# Patient Record
Sex: Female | Born: 1935 | Race: White | Hispanic: No | Marital: Single | Smoking: Never smoker
Health system: Southern US, Community
[De-identification: ages and names within clinical notes are randomized; demographics above are authoritative.]

## PROBLEM LIST (undated history)

## (undated) DIAGNOSIS — D649 Anemia, unspecified: Secondary | ICD-10-CM

## (undated) HISTORY — DX: Anemia, unspecified: D64.9

## (undated) HISTORY — PX: ABDOMINAL HYSTERECTOMY: SHX81

---

## 2014-06-28 ENCOUNTER — Encounter (HOSPITAL_BASED_OUTPATIENT_CLINIC_OR_DEPARTMENT_OTHER): Payer: Self-pay | Admitting: *Deleted

## 2014-06-28 ENCOUNTER — Emergency Department (HOSPITAL_BASED_OUTPATIENT_CLINIC_OR_DEPARTMENT_OTHER)
Admission: EM | Admit: 2014-06-28 | Discharge: 2014-06-29 | Disposition: A | Discharge status: Home / Self Care | Payer: Medicare Other | Attending: Emergency Medicine | Admitting: Emergency Medicine

## 2014-06-28 DIAGNOSIS — L03115 Cellulitis of right lower limb: Secondary | ICD-10-CM | POA: Diagnosis not present

## 2014-06-28 LAB — CBC
HEMATOCRIT: 39.7 % (ref 36.0–46.0)
Hemoglobin: 13 g/dL (ref 12.0–15.0)
MCH: 32.7 pg (ref 26.0–34.0)
MCHC: 32.7 g/dL (ref 30.0–36.0)
MCV: 100 fL (ref 78.0–100.0)
Platelets: 200 10*3/uL (ref 150–400)
RBC: 3.97 MIL/uL (ref 3.87–5.11)
RDW: 12.5 % (ref 11.5–15.5)
WBC: 5.4 10*3/uL (ref 4.0–10.5)

## 2014-06-28 LAB — BASIC METABOLIC PANEL
Anion gap: 7 (ref 5–15)
BUN: 21 mg/dL — ABNORMAL HIGH (ref 6–20)
CALCIUM: 9.2 mg/dL (ref 8.9–10.3)
CO2: 29 mmol/L (ref 22–32)
Chloride: 103 mmol/L (ref 101–111)
Creatinine, Ser: 0.7 mg/dL (ref 0.44–1.00)
Glucose, Bld: 90 mg/dL (ref 70–99)
Potassium: 4 mmol/L (ref 3.5–5.1)
Sodium: 139 mmol/L (ref 135–145)

## 2014-06-28 MED ORDER — OXYCODONE-ACETAMINOPHEN 5-325 MG PO TABS
1.0000 | ORAL_TABLET | Freq: Once | ORAL | Status: AC
  Administered 2014-06-28: 1 via ORAL
  Filled 2014-06-28: qty 1

## 2014-06-28 MED ORDER — CLINDAMYCIN PHOSPHATE 600 MG/50ML IV SOLN
600.0000 mg | Freq: Once | INTRAVENOUS | Status: AC
  Administered 2014-06-28: 600 mg via INTRAVENOUS
  Filled 2014-06-28: qty 50

## 2014-06-28 MED ORDER — CLINDAMYCIN HCL 150 MG PO CAPS: 300.0000 mg | ORAL_CAPSULE | Freq: Three times a day (TID) | ORAL | Status: DC

## 2014-06-28 NOTE — Discharge Instructions (Signed)
Return to the ED with any concerns including fever/chills, vomiting and not able to keep down liquids or antibiotics, increased area of redness, pus draining from wound, decreased level of alertness/lethargy, or any other alarming symptoms

## 2014-06-28 NOTE — ED Provider Notes (Signed)
CSN: [id number]     Arrival date & time 06/28/14  1942 History   First MD Initiated Contact with Patient 06/28/14 2035    This chart was scribed for No att. providers found by Marica OtterNusrat Rahman, ED Scribe. This patient was seen in room MHOTF/OTF and the patient's care was started at 9:29 PM.  Chief Complaint  Patient presents with  . Cellulitis   Patient is a 81 y.o. female presenting with general illness. The history is provided by the patient and a relative. No language interpreter was used.  Illness Location:  Rigth ankle & right foot Quality:  Swelling and redness Severity:  Severe Onset quality:  Sudden Duration:  1 day Timing:  Constant Chronicity:  New Associated symptoms: no fever and no vomiting    PCP: Pcp Not In System HPI Comments: Tiffany [name] is a 81 y.o. female, with PMH noted below and who uses a walker at baseline, who presents to the Emergency Department complaining of sudden onset of redness and swelling to her right ankle and foot onset yesterday. Per daughter, pt fell 10 days ago and sustained a laceration to the right lower extremity; however, at time of injury, there was no redness to the said area. Pt denies fever, vomiting, appetite change, activity change, medicinal allergies, Hx of DM, Hx of kidney problems. At the time of injury she was seen at urgent care, wound was cleaned and dressed, she was given rx for keflex, daughter states she took 2 days of this and forgot to take the rest.    History reviewed. No pertinent past medical history. Past Surgical History  Procedure Laterality Date  . Abdominal hysterectomy     History reviewed. No pertinent family history. History  Substance Use Topics  . Smoking status: Never Smoker   . Smokeless tobacco: Not on file  . Alcohol Use: No   OB History    No data available     Review of Systems  Constitutional: Negative for fever, chills, activity change and appetite change.  Cardiovascular: Positive for leg swelling  (right leg swelling with associated redness and right foot swelling ).  Gastrointestinal: Negative for vomiting.  All other systems reviewed and are negative.  Allergies  Review of patient's allergies indicates no known allergies.  Home Medications   Prior to Admission medications   Medication Sig Start Date End Date Taking? Authorizing Provider  clindamycin (CLEOCIN) 150 MG capsule Take 2 capsules (300 mg total) by mouth 3 (three) times daily. 06/28/14   Jerelyn ScottMartha Linker, MD   Triage Vitals: BP 117/74 mmHg  Pulse 70  Temp(Src) 98.3 F (36.8 C) (Oral)  Resp 20  Ht 5\' 2"  (1.575 m)  Wt 175 lb (79.379 kg)  BMI 32.00 kg/m2  SpO2 98% Vitals reviewed Physical Exam  Physical Examination: General appearance - alert, well appearing, and in no distress Mental status - alert, oriented to person, place, and time Eyes - no conjunctival injection no scleral icterus Chest - clear to auscultation, no wheezes, rales or rhonchi, symmetric air entry Heart - normal rate, regular rhythm, normal S1, S2, no murmurs, rubs, clicks or gallops Abdomen - soft, nontender, nondistended, no masses or organomegaly Musculoskeletal - no joint tenderness, deformity or swelling Extremities - peripheral pulses normal, no pedal edema, no clubbing or cyanosis Skin - erythema of right lower extremity around a partially healing laceration, no prurulent drainage  ED Course  Procedures (including critical care time) DIAGNOSTIC STUDIES: Oxygen Saturation is 98% on RA, nl by my  interpretation.    COORDINATION OF CARE: 9:32 PM-Discussed treatment plan which includes labs, possible admittance to hospital depending on lab results, and antibiotics with pt at bedside and pt agreed to plan.   Labs Review Labs Reviewed  BASIC METABOLIC PANEL - Abnormal; Notable for the following:    BUN 21 (*)    All other components within normal limits  CBC    Imaging Review No results found.   EKG Interpretation None      MDM    Final diagnoses:  Cellulitis of right lower extremity   Pt presents with c/o erythema and pain of right lower extremity around a wound that she sustained approx 1 week ago.  Erythema began yesterday.  Right lower extremity is warm, red around a central healing laceration.  Labs are reassuring with no leukocytosis.  Pt was given IV clindamycin in the ED.  She has not had trial of antibitoics for cellulitis as this began yesterday and one week ago she only had 2 days worth of keflex. Pt advised to have area rechecked in 2 days.  Doubt DVT or other acute process at this time.  Pt is overall nontoxic and well hydrated in appearance.   Discharged with strict return precautions.  Pt agreeable with plan.   I personally performed the services described in this documentation, which was scribed in my presence. The recorded information has been reviewed and is accurate.     Jerelyn ScottMartha Linker, MD 06/30/14 [phone]

## 2014-06-28 NOTE — ED Notes (Signed)
Swelling, redness, warmth noted to right leg.

## 2016-12-11 ENCOUNTER — Observation Stay (HOSPITAL_COMMUNITY)
Admission: EM | Admit: 2016-12-11 | Discharge: 2016-12-12 | Disposition: A | Discharge status: Home / Self Care | Payer: Medicare Other | Attending: Nephrology | Admitting: Nephrology

## 2016-12-11 ENCOUNTER — Encounter (HOSPITAL_COMMUNITY): Payer: Self-pay

## 2016-12-11 DIAGNOSIS — R262 Difficulty in walking, not elsewhere classified: Secondary | ICD-10-CM | POA: Diagnosis not present

## 2016-12-11 DIAGNOSIS — L03115 Cellulitis of right lower limb: Secondary | ICD-10-CM | POA: Diagnosis not present

## 2016-12-11 DIAGNOSIS — L0390 Cellulitis, unspecified: Secondary | ICD-10-CM | POA: Diagnosis present

## 2016-12-11 DIAGNOSIS — N8110 Cystocele, unspecified: Secondary | ICD-10-CM | POA: Diagnosis not present

## 2016-12-11 LAB — CBC WITH DIFFERENTIAL/PLATELET
BASOS PCT: 0 %
Basophils Absolute: 0 10*3/uL (ref 0.0–0.1)
EOS ABS: 0.2 10*3/uL (ref 0.0–0.7)
Eosinophils Relative: 5 %
HCT: 38.6 % (ref 36.0–46.0)
Hemoglobin: 12.7 g/dL (ref 12.0–15.0)
Lymphocytes Relative: 41 %
Lymphs Abs: 2 10*3/uL (ref 0.7–4.0)
MCH: 32.3 pg (ref 26.0–34.0)
MCHC: 32.9 g/dL (ref 30.0–36.0)
MCV: 98.2 fL (ref 78.0–100.0)
MONOS PCT: 13 %
Monocytes Absolute: 0.6 10*3/uL (ref 0.1–1.0)
Neutro Abs: 2 10*3/uL (ref 1.7–7.7)
Neutrophils Relative %: 41 %
Platelets: 189 10*3/uL (ref 150–400)
RBC: 3.93 MIL/uL (ref 3.87–5.11)
RDW: 12.9 % (ref 11.5–15.5)
WBC: 4.9 10*3/uL (ref 4.0–10.5)

## 2016-12-11 LAB — COMPREHENSIVE METABOLIC PANEL
ALK PHOS: 68 U/L (ref 38–126)
ALT: 19 U/L (ref 14–54)
AST: 25 U/L (ref 15–41)
Albumin: 3.8 g/dL (ref 3.5–5.0)
Anion gap: 8 (ref 5–15)
BUN: 17 mg/dL (ref 6–20)
CALCIUM: 9.3 mg/dL (ref 8.9–10.3)
CO2: 26 mmol/L (ref 22–32)
Chloride: 105 mmol/L (ref 101–111)
Creatinine, Ser: 0.71 mg/dL (ref 0.44–1.00)
GFR calc non Af Amer: >60 mL/min (ref >60)
Glucose, Bld: 105 mg/dL — ABNORMAL HIGH (ref 65–99)
Potassium: 3.7 mmol/L (ref 3.5–5.1)
SODIUM: 139 mmol/L (ref 135–145)
Total Bilirubin: 0.6 mg/dL (ref 0.3–1.2)
Total Protein: 7.5 g/dL (ref 6.5–8.1)

## 2016-12-11 MED ORDER — DOXYCYCLINE HYCLATE 100 MG PO TABS
100.0000 mg | ORAL_TABLET | Freq: Once | ORAL | Status: AC
  Administered 2016-12-12: 100 mg via ORAL
  Filled 2016-12-11: qty 1

## 2016-12-11 NOTE — ED Notes (Signed)
 on bladder scan and pt states that she has discomfort in bladder area. Prolapsed viewed by this RN, not completely out but it is right at the labia.

## 2016-12-11 NOTE — ED Triage Notes (Signed)
Pt here with her daughter due to vaginal prolapse. Pt's daughter stated that the pt was complaining of anuria and she looked and "it looked like the pt's bladder was hanging out" VSS. Pt denies pain.

## 2016-12-11 NOTE — ED Provider Notes (Signed)
[facility] EMERGENCY DEPARTMENT Provider Note   CSN: [id number] Arrival date & time: 12/11/16  2102     History   Chief Complaint Chief Complaint  Patient presents with  . Vaginal Prolapse    HPI Tiffany [name] is a 83 y.o. female.  HPI  This is a 83 year oldfemale with a history of abdominal hysterectomyho presents with concerns for vaginal prolapse. Family member states that patient has had difficulty urinating this evening.  She noted a bulge in the patient's vagina. She tried to push it back but it recurred. Patient has never had anything like this before. Denies any fevers, abdominal pain, nausea, vomiting. She does report some redness to the right lower extremity for this last several days with associated swelling. She has been using home remedies for this. No fevers.  History reviewed. No pertinent past medical history.  Patient Active Problem List   Diagnosis Date Noted  . Cellulitis 12/12/2016    Past Surgical History:  Procedure Laterality Date  . ABDOMINAL HYSTERECTOMY      OB History    No data available       Home Medications    Prior to Admission medications   Medication Sig Start Date End Date Taking? Authorizing Provider  clindamycin (CLEOCIN) 150 MG capsule Take 2 capsules (300 mg total) by mouth 3 (three) times daily. Patient not taking: Reported on 12/11/2016 06/28/14   Mabe, Latanya Maudlin, MD    Family History History reviewed. No pertinent family history.  Social History Social History  Substance Use Topics  . Smoking status: Never Smoker  . Smokeless tobacco: Not on file  . Alcohol use No     Allergies   Patient has no known allergies.   Review of Systems Review of Systems  Constitutional: Negative for fever.  Respiratory: Negative for shortness of breath.   Cardiovascular: Positive for leg swelling. Negative for chest pain.  Gastrointestinal: Negative for abdominal pain, nausea and vomiting.  Genitourinary:  Positive for difficulty urinating.       Mass/bulge in vagina  Musculoskeletal: Positive for back pain.  Skin: Positive for color change.  All other systems reviewed and are negative.    Physical Exam Updated Vital Signs BP (!) 108/49   Pulse 91   Temp 98.7 F (37.1 C) (Oral)   Resp 18   Ht 5\' 2"  (1.575 m)   Wt 75.3 kg (166 lb)   SpO2 96%   BMI 30.36 kg/m   Physical Exam  Constitutional: She is oriented to person, place, and time. She appears well-developed and well-nourished.  Elderly, well-appearing for stated age  HENT:  Head: Normocephalic and atraumatic.  Cardiovascular: Normal rate, regular rhythm and normal heart sounds.   Pulmonary/Chest: Effort normal and breath sounds normal. No respiratory distress. She has no wheezes.  Abdominal: Soft. Bowel sounds are normal. There is no tenderness.  Genitourinary:  Genitourinary Comments: Initial examination important position without evidence of obvious prolapse, patient spontaneously urinating, general vaginal atrophy noted Appears to be posterior prolapse of the vaginal wall  Musculoskeletal: She exhibits edema.  1+ bilateral lower extremity slightly increased on the right  Neurological: She is alert and oriented to person, place, and time.  Skin: Skin is warm and dry.  Warmth, redness, erythema to the right lower extremity blanching, tenderness palpation, excoriation over the anterior chest  Psychiatric: She has a normal mood and affect.  Nursing note and vitals reviewed.    ED Treatments / Results  Labs (all labs ordered  are listed, but only abnormal results are displayed) Labs Reviewed  COMPREHENSIVE METABOLIC PANEL - Abnormal; Notable for the following:       Result Value   Glucose, Bld 105 (*)    All other components within normal limits  URINALYSIS, ROUTINE W REFLEX MICROSCOPIC - Abnormal; Notable for the following:    Hgb urine dipstick SMALL (*)    Squamous Epithelial / LPF 0-5 (*)    All other components  within normal limits  CBC WITH DIFFERENTIAL/PLATELET  I-STAT CG4 LACTIC ACID, ED  I-STAT CG4 LACTIC ACID, ED    EKG  EKG Interpretation None       Radiology No results found.  Procedures Procedures (including critical care time)  Medications Ordered in ED Medications  doxycycline (VIBRA-TABS) tablet 100 mg (100 mg Oral Given 12/12/16 0036)  HYDROcodone-acetaminophen (NORCO/VICODIN) 5-325 MG per tablet 1 tablet (1 tablet Oral Given 12/12/16 0032)     Initial Impression / Assessment and Plan / ED Course  I have reviewed the triage vital signs and the nursing notes.  Pertinent labs & imaging results that were available during my care of the patient were reviewed by me and considered in my medical decision making (see chart for details).     Patient presents with difficulty urinating and concern for vaginal prolapse. Also redness and swelling to the right lower extremity. She is otherwise nontoxic. Afebrile.  GU exam was difficult to appreciate prolapse in the supine position. She was able to spontaneously void. However upon standing it appears she has a posterior vaginal wall prolapse. I discussed this with Dr. Adrian BlackwaterStinson. She may need a pessary but this is not emergent and she is able to urinate in a supine position. Additionally, she has evidence of cellulitis to the right lower extremity with increased pain. She is a 3 person assist with ambulation secondary to pain. She has 1 caregiver at home. Given her age and acute infection, feel she would benefit from physical therapy evaluation and further management under observation.  Patient was given one dose of doxycycline. She does not appear septic or systemically ill. Final Clinical Impressions(s) / ED Diagnoses   Final diagnoses:  Cellulitis of right lower extremity  Vaginal prolapse  Difficulty walking    New Prescriptions New Prescriptions   No medications on file     Shon BatonHorton, Courtney F, MD 12/12/16 [phone]

## 2016-12-12 ENCOUNTER — Observation Stay (HOSPITAL_BASED_OUTPATIENT_CLINIC_OR_DEPARTMENT_OTHER): Payer: Medicare Other

## 2016-12-12 ENCOUNTER — Encounter (HOSPITAL_COMMUNITY): Payer: Self-pay | Admitting: Internal Medicine

## 2016-12-12 DIAGNOSIS — R262 Difficulty in walking, not elsewhere classified: Secondary | ICD-10-CM

## 2016-12-12 DIAGNOSIS — N8110 Cystocele, unspecified: Secondary | ICD-10-CM | POA: Diagnosis present

## 2016-12-12 DIAGNOSIS — L03115 Cellulitis of right lower limb: Principal | ICD-10-CM

## 2016-12-12 DIAGNOSIS — L0390 Cellulitis, unspecified: Secondary | ICD-10-CM | POA: Diagnosis present

## 2016-12-12 LAB — CBC
HCT: 34.3 % — ABNORMAL LOW (ref 36.0–46.0)
HEMOGLOBIN: 10.9 g/dL — AB (ref 12.0–15.0)
MCH: 31.3 pg (ref 26.0–34.0)
MCHC: 31.8 g/dL (ref 30.0–36.0)
MCV: 98.6 fL (ref 78.0–100.0)
PLATELETS: 160 10*3/uL (ref 150–400)
RBC: 3.48 MIL/uL — AB (ref 3.87–5.11)
RDW: 13.3 % (ref 11.5–15.5)
WBC: 4.9 10*3/uL (ref 4.0–10.5)

## 2016-12-12 LAB — BASIC METABOLIC PANEL
ANION GAP: 7 (ref 5–15)
BUN: 15 mg/dL (ref 6–20)
CALCIUM: 8.7 mg/dL — AB (ref 8.9–10.3)
CO2: 29 mmol/L (ref 22–32)
CREATININE: 0.73 mg/dL (ref 0.44–1.00)
Chloride: 103 mmol/L (ref 101–111)
Glucose, Bld: 91 mg/dL (ref 65–99)
Potassium: 3.3 mmol/L — ABNORMAL LOW (ref 3.5–5.1)
SODIUM: 139 mmol/L (ref 135–145)

## 2016-12-12 LAB — URINALYSIS, ROUTINE W REFLEX MICROSCOPIC
BACTERIA UA: NONE SEEN
BILIRUBIN URINE: NEGATIVE
GLUCOSE, UA: NEGATIVE mg/dL
Ketones, ur: NEGATIVE mg/dL
LEUKOCYTES UA: NEGATIVE
Nitrite: NEGATIVE
Protein, ur: NEGATIVE mg/dL
Specific Gravity, Urine: 1.01 (ref 1.005–1.030)
pH: 6 (ref 5.0–8.0)

## 2016-12-12 LAB — I-STAT CG4 LACTIC ACID, ED: LACTIC ACID, VENOUS: 1.02 mmol/L (ref 0.5–1.9)

## 2016-12-12 MED ORDER — ACETAMINOPHEN 650 MG RE SUPP: 650.0000 mg | Freq: Four times a day (QID) | RECTAL | Status: DC | PRN

## 2016-12-12 MED ORDER — ENOXAPARIN SODIUM 40 MG/0.4ML ~~LOC~~ SOLN
40.0000 mg | SUBCUTANEOUS | Status: DC
  Administered 2016-12-12: 40 mg via SUBCUTANEOUS
  Filled 2016-12-12: qty 0.4

## 2016-12-12 MED ORDER — HYDROCODONE-ACETAMINOPHEN 5-325 MG PO TABS
1.0000 | ORAL_TABLET | Freq: Once | ORAL | Status: AC
  Administered 2016-12-12: 1 via ORAL
  Filled 2016-12-12: qty 1

## 2016-12-12 MED ORDER — ONDANSETRON HCL 4 MG/2ML IJ SOLN: 4.0000 mg | Freq: Four times a day (QID) | INTRAMUSCULAR | Status: DC | PRN

## 2016-12-12 MED ORDER — DOXYCYCLINE HYCLATE 100 MG PO CAPS: 100.0000 mg | ORAL_CAPSULE | Freq: Two times a day (BID) | ORAL | 0 refills | Status: AC

## 2016-12-12 MED ORDER — DEXTROSE 5 % IV SOLN
100.0000 mg | Freq: Two times a day (BID) | INTRAVENOUS | Status: DC
  Administered 2016-12-12: 100 mg via INTRAVENOUS
  Filled 2016-12-12 (×2): qty 100

## 2016-12-12 MED ORDER — ACETAMINOPHEN 325 MG PO TABS
650.0000 mg | ORAL_TABLET | Freq: Four times a day (QID) | ORAL | Status: DC | PRN
  Administered 2016-12-12: 650 mg via ORAL
  Filled 2016-12-12: qty 2

## 2016-12-12 MED ORDER — ONDANSETRON HCL 4 MG PO TABS: 4.0000 mg | ORAL_TABLET | Freq: Four times a day (QID) | ORAL | Status: DC | PRN

## 2016-12-12 NOTE — Discharge Summary (Signed)
Physician Discharge Summary  Tiffany [name] ZOX:[id number] DOB: [birth year] DOA: 12/11/2016  PCP: System, Pcp Not In  Admit date: 12/11/2016 Discharge date: 12/12/2016  Admitted From:home Disposition:home  Recommendations for Outpatient Follow-up:  1. Follow up with PCP in 1-2 weeks 2. Please obtain BMP/CBC in one week   Home Health:no, goes home with daughter Equipment/Devices:no Discharge Condition:stable CODE STATUS:full code Diet recommendation:heart healthy  Brief/Interim Summary: 83 year old female with no significant past medical history presented with vaginal prolapse and causing difficulty walking.Gyn Dr. Adrian Blackwater was contacted by ER recommended outpatient follow up.  Patient was admitted for right lower extremity cellulitis. Treated with IV doxycycline.patient Dilaudid acute metabolic encephalopathy due to hydrocodone. She was lethargic this morning. Later in the afternoon she is alert awake and oriented. I spoke with patient's daughter who is patient's caretaker reported that the patient's mental status is at baseline. She is willing to take patient home today. Ordered oral doxycycline. Recommended to follow up with PCP for home care services. At this time patient is medically stable to transfer her care to outpatient. -Doppler ultrasound negative for DVT.  Discharge Diagnoses:  Principal Problem:   Cellulitis of right lower extremity Active Problems:   Cellulitis   Vaginal prolapse    Discharge Instructions  Discharge Instructions    Call MD for:  difficulty breathing, headache or visual disturbances    Complete by:  As directed    Call MD for:  extreme fatigue    Complete by:  As directed    Call MD for:  hives    Complete by:  As directed    Call MD for:  persistant dizziness or light-headedness    Complete by:  As directed    Call MD for:  persistant nausea and vomiting    Complete by:  As directed    Call MD for:  severe uncontrolled pain    Complete by:  As  directed    Call MD for:  temperature >100.4    Complete by:  As directed    Diet - low sodium heart healthy    Complete by:  As directed    Increase activity slowly    Complete by:  As directed      Allergies as of 12/12/2016   No Known Allergies     Medication List    STOP taking these medications   clindamycin 150 MG capsule Commonly known as:  CLEOCIN     TAKE these medications   doxycycline 100 MG capsule Commonly known as:  VIBRAMYCIN Take 1 capsule (100 mg total) by mouth 2 (two) times daily.      Follow-up Information    [facility]. Schedule an appointment as soon as possible for a visit in 1 week(s).   Contact information: [address] [facility] [address] [phone]       Levie Heritage, DO. Schedule an appointment as soon as possible for a visit in 1 week(s).   Specialty:  Family Medicine Contact information: [address] [place] [address] [phone]          No Known Allergies  Consultations: ER consulted Dr. Adrian Blackwater from Gyn  Procedures/Studies: Doppler ultrasound  Subjective: Seen and examined at bedside.Patient is alert awake and mental status around baseline.  Discharge Exam: Vitals:   12/12/16 0423 12/12/16 1000  BP: (!) 99/54 112/66  Pulse: 72 82  Resp: 19 20  Temp: 97.7 F (36.5 C) 98 F (36.7 C)  SpO2:  98% 98%   Vitals:   12/12/16 0200 12/12/16 0300 12/12/16 0423 12/12/16 1000  BP: 104/86 (!) 119/94 (!) 99/54 112/66  Pulse: 90 82 72 82  Resp:   19 20  Temp:   97.7 F (36.5 C) 98 F (36.7 C)  TempSrc:   Oral Oral  SpO2: 96% 97% 98% 98%  Weight:   75.3 kg (166 lb 0.1 oz)   Height:   5\' 2"  (1.575 m)     General: Pt is alert, awake, not in acute distress Cardiovascular: RRR, S1/S2 +, no rubs, no gallops Respiratory: CTA bilaterally, no wheezing, no rhonchi Abdominal: Soft, NT, ND, bowel sounds + Extremities: no edema, no cyanosis, right lower  extremity has a small open wound requires frequent dressing change.    The results of significant diagnostics from this hospitalization (including imaging, microbiology, ancillary and laboratory) are listed below for reference.     Microbiology: No results found for this or any previous visit (from the past 240 hour(s)).   Labs: BNP (last 3 results) No results for input(s): BNP in the last 8760 hours. Basic Metabolic Panel:  Recent Labs Lab 12/11/16 2149 12/12/16 0404  NA 139 139  K 3.7 3.3*  CL 105 103  CO2 26 29  GLUCOSE 105* 91  BUN 17 15  CREATININE 0.71 0.73  CALCIUM 9.3 8.7*   Liver Function Tests:  Recent Labs Lab 12/11/16 2149  AST 25  ALT 19  ALKPHOS 68  BILITOT 0.6  PROT 7.5  ALBUMIN 3.8   No results for input(s): LIPASE, AMYLASE in the last 168 hours. No results for input(s): AMMONIA in the last 168 hours. CBC:  Recent Labs Lab 12/11/16 2149 12/12/16 0404  WBC 4.9 4.9  NEUTROABS 2.0  --   HGB 12.7 10.9*  HCT 38.6 34.3*  MCV 98.2 98.6  PLT 189 160   Cardiac Enzymes: No results for input(s): CKTOTAL, CKMB, CKMBINDEX, TROPONINI in the last 168 hours. BNP: Invalid input(s): POCBNP CBG: No results for input(s): GLUCAP in the last 168 hours. D-Dimer No results for input(s): DDIMER in the last 72 hours. Hgb A1c No results for input(s): HGBA1C in the last 72 hours. Lipid Profile No results for input(s): CHOL, HDL, LDLCALC, TRIG, CHOLHDL, LDLDIRECT in the last 72 hours. Thyroid function studies No results for input(s): TSH, T4TOTAL, T3FREE, THYROIDAB in the last 72 hours.  Invalid input(s): FREET3 Anemia work up No results for input(s): VITAMINB12, FOLATE, FERRITIN, TIBC, IRON, RETICCTPCT in the last 72 hours. Urinalysis    Component Value Date/Time   COLORURINE YELLOW 12/11/2016 2347   APPEARANCEUR CLEAR 12/11/2016 2347   LABSPEC 1.010 12/11/2016 2347   PHURINE 6.0 12/11/2016 2347   GLUCOSEU NEGATIVE 12/11/2016 2347   HGBUR SMALL (A)  12/11/2016 2347   BILIRUBINUR NEGATIVE 12/11/2016 2347   KETONESUR NEGATIVE 12/11/2016 2347   PROTEINUR NEGATIVE 12/11/2016 2347   NITRITE NEGATIVE 12/11/2016 2347   LEUKOCYTESUR NEGATIVE 12/11/2016 2347   Sepsis Labs Invalid input(s): PROCALCITONIN,  WBC,  LACTICIDVEN Microbiology No results found for this or any previous visit (from the past 240 hour(s)).   Time coordinating discharge: Over 30 minutes  SIGNED:   Maxie Barbron Prasad Ceirra Belli, MD  [facility] 12/12/2016, 3:43 PM  If 7PM-7AM, please contact night-coverage [email or web address] Password [email or web address]

## 2016-12-12 NOTE — Progress Notes (Signed)
PT Cancellation Note  Patient Details Name: Tiffany [name] MRN: [id number] DOB: 08/02/1923   Cancelled Treatment:    Reason Eval/Treat Not Completed: Other (comment) RN reporting pt very sleepy after doppler testing and requesting to come back later. Will reattempt as schedule allows.   Tiffany [name], PT, DPT  Acute Rehabilitation Services  Pager: [phone]    Tiffany [name] 12/12/2016, 10:52 AM

## 2016-12-12 NOTE — H&P (Signed)
History and Physical    Tiffany [name]  PCP: System, Pcp Not In  Patient coming from: Home.  Chief Complaint: Vaginal prolapse and difficulty walking.  HPI: Tiffany [name] is a 83 y.o. female no significant past medical history presented to the ER because of increasing difficulty walking.  As per the ER physician patient initially presented to the ER because of difficulty urinating with patient noticing a bulge in the vaginal area patient try to reduce it but it started recurring.  ER physician had discussed with on-call OB/GYN who advised follow-up as outpatient for pessary.    ED Course: In the ER it was found that patient was having difficulty walking because of right lower extremity swelling.  Patient states over the last 3 days patient swelling has worsened.  Patient usually uses a walker to walk around.  Patient has erythema of the lower extremities more on the right side with increasing edema as per the patient.  Patient is being admitted for observation and antibiotics for cellulitis and physical therapy consult.   Review of Systems: As per HPI, rest all negative.   History reviewed. No pertinent past medical history.  Past Surgical History:  Procedure Laterality Date  . ABDOMINAL HYSTERECTOMY       reports that she has never smoked. She has never used smokeless tobacco. She reports that she does not drink alcohol or use drugs.  No Known Allergies  Family History  Problem Relation Age of Onset  . Hypertension Other     Prior to Admission medications   Medication Sig Start Date End Date Taking? Authorizing Provider  clindamycin (CLEOCIN) 150 MG capsule Take 2 capsules (300 mg total) by mouth 3 (three) times daily. Patient not taking: Reported on [name] 06/28/14   Phillis HaggisMabe, Martha L, MD    Physical Exam: Vitals:   12/12/16 0030 12/12/16 0101 12/12/16 0200 12/12/16 0300  BP: 102/71 (!) 108/49 104/86 (!) 119/94  Pulse: 96 91  90 82  Resp:      Temp:      TempSrc:      SpO2: 98% 96% 96% 97%  Weight:      Height:          Constitutional: Moderately built and nourished. Vitals:   12/12/16 0030 12/12/16 0101 12/12/16 0200 12/12/16 0300  BP: 102/71 (!) 108/49 104/86 (!) 119/94  Pulse: 96 91 90 82  Resp:      Temp:      TempSrc:      SpO2: 98% 96% 96% 97%  Weight:      Height:       Eyes: Anicteric no pallor. ENMT: No discharge from the ears eyes nose or mouth. Neck: No mass felt.  No neck rigidity. Respiratory: No rhonchi or crepitations. Cardiovascular: S1-S2 heard no murmurs appreciated. Abdomen: Soft nontender bowel sounds present. Musculoskeletal: Bilateral lower extremity swelling more on the right side with erythema. Skin: Erythema of the both lower extremity more on the right side mostly on the anterior shin but also encompassing the whole calf. Neurologic: Alert awake oriented to time place and person.  Moves all extremities. Psychiatric: Appears normal.  Normal affect.   Labs on Admission: I have personally reviewed following labs and imaging studies  CBC:  Recent Labs Lab 12/11/16 2149  WBC 4.9  NEUTROABS 2.0  HGB 12.7  HCT 38.6  MCV 98.2  PLT 189   Basic Metabolic Panel:  Recent Labs Lab 12/11/16 2149  NA  139  K 3.7  CL 105  CO2 26  GLUCOSE 105*  BUN 17  CREATININE 0.71  CALCIUM 9.3   GFR: Estimated Creatinine Clearance: 44.4 mL/min (by C-G formula based on SCr of 0.71 mg/dL). Liver Function Tests:  Recent Labs Lab 12/11/16 2149  AST 25  ALT 19  ALKPHOS 68  BILITOT 0.6  PROT 7.5  ALBUMIN 3.8   No results for input(s): LIPASE, AMYLASE in the last 168 hours. No results for input(s): AMMONIA in the last 168 hours. Coagulation Profile: No results for input(s): INR, PROTIME in the last 168 hours. Cardiac Enzymes: No results for input(s): CKTOTAL, CKMB, CKMBINDEX, TROPONINI in the last 168 hours. BNP (last 3 results) No results for input(s): PROBNP in the  last 8760 hours. HbA1C: No results for input(s): HGBA1C in the last 72 hours. CBG: No results for input(s): GLUCAP in the last 168 hours. Lipid Profile: No results for input(s): CHOL, HDL, LDLCALC, TRIG, CHOLHDL, LDLDIRECT in the last 72 hours. Thyroid Function Tests: No results for input(s): TSH, T4TOTAL, FREET4, T3FREE, THYROIDAB in the last 72 hours. Anemia Panel: No results for input(s): VITAMINB12, FOLATE, FERRITIN, TIBC, IRON, RETICCTPCT in the last 72 hours. Urine analysis:    Component Value Date/Time   COLORURINE YELLOW [name] 2347   APPEARANCEUR CLEAR [name] 2347   LABSPEC 1.010 [name] 2347   PHURINE 6.0 [name] 2347   GLUCOSEU NEGATIVE [name] 2347   HGBUR SMALL (A) [name] 2347   BILIRUBINUR NEGATIVE [name] 2347   KETONESUR NEGATIVE [name] 2347   PROTEINUR NEGATIVE [name] 2347   NITRITE NEGATIVE [name] 2347   LEUKOCYTESUR NEGATIVE [name] 2347   Sepsis Labs: @LABRCNTIP (procalcitonin:4,lacticidven:4) )No results found for this or any previous visit (from the past 240 hour(s)).   Radiological Exams on Admission: No results found.   Assessment/Plan Principal Problem:   Cellulitis of right lower extremity Active Problems:   Cellulitis   Vaginal prolapse    1. Right lower extremity cellulitis -patient has been placed on IV doxycycline.  Check Dopplers to rule out DVT. 2. Difficulty walking due to lower extremity cellulitis -will get physical therapy consult. 3. Vaginal prolapse -will need outpatient follow-up with OB/GYN.   DVT prophylaxis: Lovenox. Code Status: Full code. Family Communication: Discussed with patient. Disposition Plan: Home. Consults called: Physical therapy. Admission status: Observation.   Eduard Clos MD [facility] Pager [phone].  If 7PM-7AM, please contact night-coverage [email or web address] Password [email or web address]  12/12/2016, 3:13 AM

## 2016-12-12 NOTE — Consult Note (Addendum)
WOC Nurse wound consult note Reason for Consult: Consult requested for right leg wound. Wound type: Ful thickness stasis ulcer to outer calf Measurement: .8X.8X.2cm Wound bed: yellow, moist wound bed Drainage (amount, consistency, odor) Mod amt yellow drainage, no odor Periwound: intact skin surrounding.  Generalized edema and erythremia to RLE Dressing procedure/placement/frequency: Foam dressing to absorb drainage and promote healing. No family members present to discuss plan of care. Please re-consult if further assistance is needed.  Thank-you,  Cammie Mcgeeawn Kemyah Buser MSN, RN, CWOCN, [place], CNS [phone]

## 2016-12-12 NOTE — Progress Notes (Signed)
PT Cancellation Note  Patient Details Name: Tiffany [name] MRN: [id number] DOB: 05/11/1923   Cancelled Treatment:    Reason Eval/Treat Not Completed: Other (comment) Pt continues to be very sleepy and lethargic following testing. Notified RN. Will hold until pt more alert and as schedule allows.   Gladys DammeBrittany Nancy Arvin, PT, DPT  Acute Rehabilitation Services  Pager: [phone]  Lehman PromBrittany S Nason Conradt 12/12/2016, 1:36 PM

## 2016-12-12 NOTE — Progress Notes (Signed)
Patient was seen and examined at bedside.admitted after midnight. Please see H&P for detail.  83 year old female presented with vaginal prolapse and difficulty walking. She was found to have right lower extremity cellulitis and he started on IV doxycycline. Ultrasound Doppler ordered.  Patient was somnolent this morning as she did not sleep last night and received a dose of hydrocodone. Discontinue sedatives. Patient has no focal neurological deficit.  PT, OT evaluation.  As per H&P, OB/GYN was consulted who recommended outpatient follow-up. DVT prophylaxis with Lovenox subcutaneous.

## 2016-12-12 NOTE — Progress Notes (Signed)
Tyna JakschSarah A Clyne to be D/C'd Home per MD order.  Discussed prescriptions and follow up appointments with the patient. Prescriptions given to patient, medication list explained in detail. Pt verbalized understanding.  Allergies as of 12/12/2016   No Known Allergies     Medication List    STOP taking these medications   clindamycin 150 MG capsule Commonly known as:  CLEOCIN     TAKE these medications   doxycycline 100 MG capsule Commonly known as:  VIBRAMYCIN Take 1 capsule (100 mg total) by mouth 2 (two) times daily.       Vitals:   12/12/16 0423 12/12/16 1000  BP: (!) 99/54 112/66  Pulse: 72 82  Resp: 19 20  Temp: 97.7 F (36.5 C) 98 F (36.7 C)  SpO2: 98% 98%    Skin clean, dry and intact without evidence of skin break down, no evidence of skin tears noted. IV catheter discontinued intact. Site without signs and symptoms of complications. Dressing and pressure applied. Pt denies pain at this time. No complaints noted.  An After Visit Summary was printed and given to the patient. Patient escorted via WC, and D/C home via private auto.  GrenadaBrittany Darcell Yacoub RN

## 2016-12-12 NOTE — Progress Notes (Signed)
Left [facility] compliant message requesting callback to patient's daughter. Tiffany [name],Karen Daughter   [phone]   Will discuss disposition home vs SNF as requested by CM consult when she returns call.

## 2016-12-12 NOTE — Progress Notes (Signed)
Arrival Method: Patient arrived in stretcher from ED. Mental Orientation: alert and oriented to person.place, location Telemetry:14Assessment: See Doc Flow sheets. Skin: Warm, dry and intact. IV: Peripheral Iright forearmPain:. Fall Prevention Safety Plan: Patient educated about fall prevention safety plan, understood and acknowledged.BED ALARM PLACED Admission Screening:6700 Orientation: Patient has been oriented to the unit, staff and to the room.

## 2016-12-14 ENCOUNTER — Encounter: Payer: Self-pay | Admitting: Obstetrics & Gynecology

## 2016-12-14 ENCOUNTER — Ambulatory Visit (INDEPENDENT_AMBULATORY_CARE_PROVIDER_SITE_OTHER): Payer: Medicare Other | Admitting: Obstetrics & Gynecology

## 2016-12-14 VITALS — BP 122/84 | HR 97

## 2016-12-14 DIAGNOSIS — N8110 Cystocele, unspecified: Secondary | ICD-10-CM

## 2016-12-14 NOTE — Progress Notes (Signed)
History:  83 y.o. G4P4 here today for eval of vaginal prolapse. S/p SVD x4. Pt was noted to have inability to void earlier this week. Her daughter noted a bulge coming from her vagina. Pt does not recall this occurring prev. Since the ED visit she is voiding normally. Pt is NOT incontinent of urine.   Was checked in the ED for UTI.  The following portions of the patient's history were reviewed and updated as appropriate: allergies, current medications, past family history, past medical history, past social history, past surgical history and problem list.    Objective:  Physical Exam Blood pressure 122/84, pulse 97. Gen: very pleasant 83 year old here with her daughter. She is ambulatory with a walker and assist.  Pelvic: Normal appearing external genitalia; there is grade III prolapse of the vaginal cuff and the bladder. The tissue of the bladder is WNL and healthy although a bit atrophic. The uterus and cervix are surgically absent. Normal discharge. No other palpable masses, no uterine or adnexal tenderness  PESSARY FITTING AND PLACEMENT During pelvic exam, patient was examined. Her vaginal introitus size, vaginal length, and prolapse stage wereused to guide selection of pessary type and size. After evaluation, a 5cm doughnut support pessary fitting guide was placed and patient walked around room with it in place.  No prolapse was noted in standing, or in lithotomy position even after Valsalva.  The fitter was removed.   Assessment & Plan:  Vaginal vault and bladder prolaaspe  Pt fitted for a pessary  Pessary ordered  Staff to contact pt to comin for pessary once it arrives.   Reviewed with daughter casre of the pessary and increased risk of incontinece once the pessary is in. Only time will tell. They wish to proceed.  Total face-to-face time with patient was 30 min.  Greater than 50% was spent in counseling and coordination of care with the patient.    Shrinika Blatz L. Harraway-Smith, M.D.,  [place] CoreFACOG

## 2019-05-06 ENCOUNTER — Emergency Department (HOSPITAL_COMMUNITY): Payer: Medicare Other

## 2019-05-06 ENCOUNTER — Other Ambulatory Visit: Payer: Self-pay

## 2019-05-06 ENCOUNTER — Inpatient Hospital Stay (HOSPITAL_COMMUNITY)
Admission: EM | Admit: 2019-05-06 | Discharge: 2019-05-14 | DRG: 480 | Disposition: A | Discharge status: Hospice | Payer: Medicare Other | Attending: Internal Medicine | Admitting: Internal Medicine

## 2019-05-06 ENCOUNTER — Encounter (HOSPITAL_COMMUNITY): Payer: Self-pay | Admitting: Emergency Medicine

## 2019-05-06 DIAGNOSIS — I9581 Postprocedural hypotension: Secondary | ICD-10-CM | POA: Diagnosis not present

## 2019-05-06 DIAGNOSIS — I340 Nonrheumatic mitral (valve) insufficiency: Secondary | ICD-10-CM | POA: Diagnosis not present

## 2019-05-06 DIAGNOSIS — I110 Hypertensive heart disease with heart failure: Secondary | ICD-10-CM | POA: Diagnosis not present

## 2019-05-06 DIAGNOSIS — J189 Pneumonia, unspecified organism: Secondary | ICD-10-CM | POA: Diagnosis not present

## 2019-05-06 DIAGNOSIS — S72001A Fracture of unspecified part of neck of right femur, initial encounter for closed fracture: Secondary | ICD-10-CM | POA: Diagnosis present

## 2019-05-06 DIAGNOSIS — Z20822 Contact with and (suspected) exposure to covid-19: Secondary | ICD-10-CM | POA: Diagnosis present

## 2019-05-06 DIAGNOSIS — F0390 Unspecified dementia without behavioral disturbance: Secondary | ICD-10-CM | POA: Diagnosis present

## 2019-05-06 DIAGNOSIS — Y92009 Unspecified place in unspecified non-institutional (private) residence as the place of occurrence of the external cause: Secondary | ICD-10-CM | POA: Diagnosis not present

## 2019-05-06 DIAGNOSIS — D500 Iron deficiency anemia secondary to blood loss (chronic): Secondary | ICD-10-CM | POA: Diagnosis not present

## 2019-05-06 DIAGNOSIS — I351 Nonrheumatic aortic (valve) insufficiency: Secondary | ICD-10-CM | POA: Diagnosis not present

## 2019-05-06 DIAGNOSIS — Z9181 History of falling: Secondary | ICD-10-CM

## 2019-05-06 DIAGNOSIS — R627 Adult failure to thrive: Secondary | ICD-10-CM | POA: Diagnosis not present

## 2019-05-06 DIAGNOSIS — L89152 Pressure ulcer of sacral region, stage 2: Secondary | ICD-10-CM | POA: Diagnosis not present

## 2019-05-06 DIAGNOSIS — I4891 Unspecified atrial fibrillation: Secondary | ICD-10-CM

## 2019-05-06 DIAGNOSIS — I5021 Acute systolic (congestive) heart failure: Secondary | ICD-10-CM | POA: Diagnosis not present

## 2019-05-06 DIAGNOSIS — N179 Acute kidney failure, unspecified: Secondary | ICD-10-CM | POA: Diagnosis not present

## 2019-05-06 DIAGNOSIS — L8990 Pressure ulcer of unspecified site, unspecified stage: Secondary | ICD-10-CM | POA: Insufficient documentation

## 2019-05-06 DIAGNOSIS — I480 Paroxysmal atrial fibrillation: Secondary | ICD-10-CM | POA: Diagnosis present

## 2019-05-06 DIAGNOSIS — Z90710 Acquired absence of both cervix and uterus: Secondary | ICD-10-CM

## 2019-05-06 DIAGNOSIS — I429 Cardiomyopathy, unspecified: Secondary | ICD-10-CM | POA: Diagnosis not present

## 2019-05-06 DIAGNOSIS — Z515 Encounter for palliative care: Secondary | ICD-10-CM | POA: Diagnosis not present

## 2019-05-06 DIAGNOSIS — Z66 Do not resuscitate: Secondary | ICD-10-CM | POA: Diagnosis not present

## 2019-05-06 DIAGNOSIS — J9601 Acute respiratory failure with hypoxia: Secondary | ICD-10-CM | POA: Diagnosis not present

## 2019-05-06 DIAGNOSIS — D62 Acute posthemorrhagic anemia: Secondary | ICD-10-CM | POA: Diagnosis not present

## 2019-05-06 DIAGNOSIS — R6521 Severe sepsis with septic shock: Secondary | ICD-10-CM | POA: Diagnosis not present

## 2019-05-06 DIAGNOSIS — D696 Thrombocytopenia, unspecified: Secondary | ICD-10-CM | POA: Diagnosis not present

## 2019-05-06 DIAGNOSIS — R1310 Dysphagia, unspecified: Secondary | ICD-10-CM | POA: Diagnosis present

## 2019-05-06 DIAGNOSIS — Y95 Nosocomial condition: Secondary | ICD-10-CM | POA: Diagnosis not present

## 2019-05-06 DIAGNOSIS — Z7189 Other specified counseling: Secondary | ICD-10-CM | POA: Diagnosis not present

## 2019-05-06 DIAGNOSIS — I420 Dilated cardiomyopathy: Secondary | ICD-10-CM | POA: Diagnosis not present

## 2019-05-06 DIAGNOSIS — Z8249 Family history of ischemic heart disease and other diseases of the circulatory system: Secondary | ICD-10-CM | POA: Diagnosis not present

## 2019-05-06 DIAGNOSIS — S72142A Displaced intertrochanteric fracture of left femur, initial encounter for closed fracture: Secondary | ICD-10-CM | POA: Diagnosis present

## 2019-05-06 DIAGNOSIS — I5041 Acute combined systolic (congestive) and diastolic (congestive) heart failure: Secondary | ICD-10-CM | POA: Diagnosis not present

## 2019-05-06 DIAGNOSIS — R296 Repeated falls: Secondary | ICD-10-CM | POA: Diagnosis present

## 2019-05-06 DIAGNOSIS — R578 Other shock: Secondary | ICD-10-CM | POA: Diagnosis not present

## 2019-05-06 DIAGNOSIS — A419 Sepsis, unspecified organism: Secondary | ICD-10-CM | POA: Diagnosis not present

## 2019-05-06 DIAGNOSIS — S72002A Fracture of unspecified part of neck of left femur, initial encounter for closed fracture: Secondary | ICD-10-CM

## 2019-05-06 DIAGNOSIS — T8111XA Postprocedural  cardiogenic shock, initial encounter: Secondary | ICD-10-CM | POA: Diagnosis not present

## 2019-05-06 DIAGNOSIS — J811 Chronic pulmonary edema: Secondary | ICD-10-CM

## 2019-05-06 DIAGNOSIS — E861 Hypovolemia: Secondary | ICD-10-CM | POA: Diagnosis not present

## 2019-05-06 DIAGNOSIS — R579 Shock, unspecified: Secondary | ICD-10-CM | POA: Diagnosis not present

## 2019-05-06 DIAGNOSIS — Z8781 Personal history of (healed) traumatic fracture: Secondary | ICD-10-CM

## 2019-05-06 DIAGNOSIS — R0602 Shortness of breath: Secondary | ICD-10-CM

## 2019-05-06 DIAGNOSIS — Z6824 Body mass index (BMI) 24.0-24.9, adult: Secondary | ICD-10-CM

## 2019-05-06 DIAGNOSIS — Z419 Encounter for procedure for purposes other than remedying health state, unspecified: Secondary | ICD-10-CM

## 2019-05-06 DIAGNOSIS — W1830XA Fall on same level, unspecified, initial encounter: Secondary | ICD-10-CM | POA: Diagnosis present

## 2019-05-06 DIAGNOSIS — I959 Hypotension, unspecified: Secondary | ICD-10-CM | POA: Diagnosis not present

## 2019-05-06 DIAGNOSIS — R531 Weakness: Secondary | ICD-10-CM | POA: Diagnosis not present

## 2019-05-06 LAB — CBC WITH DIFFERENTIAL/PLATELET
Abs Immature Granulocytes: 0.03 10*3/uL (ref 0.00–0.07)
Basophils Absolute: 0 10*3/uL (ref 0.0–0.1)
Basophils Relative: 0 %
Eosinophils Absolute: 0.1 10*3/uL (ref 0.0–0.5)
Eosinophils Relative: 1 %
HCT: 31.7 % — ABNORMAL LOW (ref 36.0–46.0)
Hemoglobin: 10 g/dL — ABNORMAL LOW (ref 12.0–15.0)
Immature Granulocytes: 0 %
Lymphocytes Relative: 21 %
Lymphs Abs: 1.7 10*3/uL (ref 0.7–4.0)
MCH: 32.6 pg (ref 26.0–34.0)
MCHC: 31.5 g/dL (ref 30.0–36.0)
MCV: 103.3 fL — ABNORMAL HIGH (ref 80.0–100.0)
Monocytes Absolute: 1.3 10*3/uL — ABNORMAL HIGH (ref 0.1–1.0)
Monocytes Relative: 17 %
Neutro Abs: 5 10*3/uL (ref 1.7–7.7)
Neutrophils Relative %: 61 %
Platelets: 128 10*3/uL — ABNORMAL LOW (ref 150–400)
RBC: 3.07 MIL/uL — ABNORMAL LOW (ref 3.87–5.11)
RDW: 13.1 % (ref 11.5–15.5)
WBC: 8.1 10*3/uL (ref 4.0–10.5)
nRBC: 0 % (ref 0.0–0.2)

## 2019-05-06 LAB — RESPIRATORY PANEL BY RT PCR (FLU A&B, COVID)
Influenza A by PCR: NEGATIVE
Influenza B by PCR: NEGATIVE
SARS Coronavirus 2 by RT PCR: NEGATIVE

## 2019-05-06 LAB — ABO/RH: ABO/RH(D): A NEG

## 2019-05-06 LAB — BASIC METABOLIC PANEL
Anion gap: 7 (ref 5–15)
BUN: 32 mg/dL — ABNORMAL HIGH (ref 8–23)
CO2: 27 mmol/L (ref 22–32)
Calcium: 8.9 mg/dL (ref 8.9–10.3)
Chloride: 105 mmol/L (ref 98–111)
Creatinine, Ser: 0.68 mg/dL (ref 0.44–1.00)
GFR calc Af Amer: >60 mL/min (ref >60)
GFR calc non Af Amer: >60 mL/min (ref >60)
Glucose, Bld: 111 mg/dL — ABNORMAL HIGH (ref 70–99)
Potassium: 3.9 mmol/L (ref 3.5–5.1)
Sodium: 139 mmol/L (ref 135–145)

## 2019-05-06 LAB — PROTIME-INR
INR: 1.2 (ref 0.8–1.2)
Prothrombin Time: 15.2 seconds (ref 11.4–15.2)

## 2019-05-06 MED ORDER — INFLUENZA VAC A&B SA ADJ QUAD 0.5 ML IM PRSY
0.5000 mL | PREFILLED_SYRINGE | INTRAMUSCULAR | Status: DC
  Filled 2019-05-06: qty 0.5

## 2019-05-06 MED ORDER — MORPHINE SULFATE (PF) 2 MG/ML IV SOLN
2.0000 mg | INTRAVENOUS | Status: DC | PRN
  Administered 2019-05-06 – 2019-05-07 (×5): 2 mg via INTRAVENOUS
  Filled 2019-05-06 (×6): qty 1

## 2019-05-06 MED ORDER — HYDROCODONE-ACETAMINOPHEN 5-325 MG PO TABS
1.0000 | ORAL_TABLET | Freq: Four times a day (QID) | ORAL | Status: DC | PRN
  Administered 2019-05-06 – 2019-05-10 (×2): 1 via ORAL
  Administered 2019-05-11: 2 via ORAL
  Administered 2019-05-11: 1 via ORAL
  Administered 2019-05-12: 2 via ORAL
  Filled 2019-05-06: qty 1
  Filled 2019-05-06: qty 2
  Filled 2019-05-06 (×2): qty 1
  Filled 2019-05-06: qty 2

## 2019-05-06 MED ORDER — ENSURE PRE-SURGERY PO LIQD
296.0000 mL | Freq: Once | ORAL | Status: AC
  Administered 2019-05-07: 01:00:00 296 mL via ORAL
  Filled 2019-05-06: qty 296

## 2019-05-06 MED ORDER — MORPHINE SULFATE (PF) 2 MG/ML IV SOLN
0.5000 mg | INTRAVENOUS | Status: DC | PRN
  Administered 2019-05-08 – 2019-05-14 (×14): 0.5 mg via INTRAVENOUS
  Filled 2019-05-06 (×16): qty 1

## 2019-05-06 MED ORDER — ONDANSETRON HCL 4 MG/2ML IJ SOLN
4.0000 mg | Freq: Four times a day (QID) | INTRAMUSCULAR | Status: DC | PRN
  Administered 2019-05-06: 4 mg via INTRAVENOUS
  Filled 2019-05-06: qty 2

## 2019-05-06 MED ORDER — ENOXAPARIN SODIUM 40 MG/0.4ML ~~LOC~~ SOLN
40.0000 mg | SUBCUTANEOUS | Status: DC
  Administered 2019-05-06 – 2019-05-08 (×3): 40 mg via SUBCUTANEOUS
  Filled 2019-05-06 (×3): qty 0.4

## 2019-05-06 MED ORDER — CEFAZOLIN SODIUM-DEXTROSE 2-4 GM/100ML-% IV SOLN
2.0000 g | INTRAVENOUS | Status: AC
  Administered 2019-05-07: 2 g via INTRAVENOUS
  Filled 2019-05-06: qty 100

## 2019-05-06 NOTE — ED Provider Notes (Signed)
[facility] DEPT Provider Note   CSN: [id number] Arrival date & time: 05/06/19  1214     History No chief complaint on file.   Tiffany [name] is a 86 y.o. female.  Patient presents to the emergency department by [facility] with c/o fall yesterday.  History from EMS as patient is in too much pain to give history and also has history of dementia.  She presents from home where she lives with her daughter.  Patient had an unwitnessed fall yesterday and it was reported that she began having severe pain in her left hip and leg today.  Family gave a 5-325 mg Percocet last night and this AM.  EMS administered 100 mcg fentanyl.  Pain is worse with any movement.  Level 5 caveat due to severe pain, dementia.   Additional history obtained from the patient's daughter [name] by telephone.  Patient typically ambulates with walker and is able to do ADLs.  Patient had an unwitnessed fall yesterday.  She has been at her baseline mental status.  No evidence that she hit her head.  No blood thinners and she is not currently on any medications.  Daughter was able to help her up off the floor to a standing position but she was unable to walk last night.  She had a decreased appetite this morning and pain increased to the point where she could not get out of a chair.  EMS was called for transport.        Past Medical History:  Diagnosis Date  . Anemia     Patient Active Problem List   Diagnosis Date Noted  . Cellulitis 12/12/2016  . Cellulitis of right lower extremity 12/12/2016  . Vaginal prolapse 12/12/2016  . Difficulty walking     Past Surgical History:  Procedure Laterality Date  . ABDOMINAL HYSTERECTOMY       OB History    Gravida  4   Para  4   Term      Preterm      AB      Living        SAB      TAB      Ectopic      Multiple      Live Births  4           Family History  Problem Relation Age of Onset  . Hypertension Other     Social  History   Tobacco Use  . Smoking status: Never Smoker  . Smokeless tobacco: Never Used  Substance Use Topics  . Alcohol use: No  . Drug use: No    Home Medications Prior to Admission medications   Not on File    Allergies    Patient has no known allergies.  Review of Systems   Review of Systems  Unable to perform ROS: Mental status change  Musculoskeletal: Positive for arthralgias and myalgias.    Physical Exam Updated Vital Signs BP 121/75 (BP Location: Left Arm)   Pulse 85   Temp 98 F (36.7 C) (Oral)   Resp 17   Ht 5\' 4"  (1.626 m)   Wt 61.2 kg   SpO2 100%   BMI 23.17 kg/m   Physical Exam Vitals and nursing note reviewed.  Constitutional:      Appearance: She is well-developed.  HENT:     Head: Normocephalic and atraumatic.  Eyes:     General:        Right eye: No discharge.  Left eye: No discharge.     Conjunctiva/sclera: Conjunctivae normal.  Cardiovascular:     Rate and Rhythm: Normal rate. Rhythm irregular.     Pulses:          Dorsalis pedis pulses are 1+ on the right side and 1+ on the left side.     Heart sounds: Normal heart sounds.     Comments: Pedal pulses weak, symmetric bilaterally.  Pulmonary:     Effort: Pulmonary effort is normal.     Breath sounds: Normal breath sounds.  Abdominal:     Palpations: Abdomen is soft.     Tenderness: There is no abdominal tenderness. There is no guarding or rebound.  Musculoskeletal:        General: Tenderness present.     Cervical back: Normal range of motion and neck supple.     Right lower leg: Edema present.     Left lower leg: Edema present.     Comments: 1+ pitting edema, symmetric, bilateral feet and ankles with venous stasis changes.  Skin:    General: Skin is warm and dry.  Neurological:     Mental Status: She is alert.     ED Results / Procedures / Treatments   Labs (all labs ordered are listed, but only abnormal results are displayed) Labs Reviewed  BASIC METABOLIC PANEL -  Abnormal; Notable for the following components:      Result Value   Glucose, Bld 111 (*)    BUN 32 (*)    All other components within normal limits  CBC WITH DIFFERENTIAL/PLATELET - Abnormal; Notable for the following components:   RBC 3.07 (*)    Hemoglobin 10.0 (*)    HCT 31.7 (*)    MCV 103.3 (*)    Platelets 128 (*)    Monocytes Absolute 1.3 (*)    All other components within normal limits  RESPIRATORY PANEL BY RT PCR (FLU A&B, COVID)  PROTIME-INR  URINALYSIS, ROUTINE W REFLEX MICROSCOPIC  TYPE AND SCREEN    ED ECG REPORT   Date: 05/06/2019  Rate: 84  Rhythm: atrial fibrillation  QRS Axis: normal  Intervals: normal  ST/T Wave abnormalities: normal  Conduction Disutrbances:none  Narrative Interpretation:   Old EKG Reviewed: none available  I have personally reviewed the EKG tracing and agree with the computerized printout as noted.  Radiology DG Hip Unilat W or Wo Pelvis 2-3 Views Left  Result Date: 05/06/2019 CLINICAL DATA:  Diffuse left hip pain.  Fall EXAM: DG HIP (WITH OR WITHOUT PELVIS) 2-3V LEFT COMPARISON:  None. FINDINGS: There is a left femoral intertrochanteric fracture with varus angulation and displacement. No subluxation or dislocation. IMPRESSION: Left femoral intertrochanteric fracture with displacement and varus angulation. Electronically Signed   By: Charlett Nose M.D.   On: 05/06/2019 13:28    Procedures Procedures (including critical care time)  Medications Ordered in ED Medications  morphine 2 MG/ML injection 2 mg (2 mg Intravenous Given 05/06/19 1407)  ondansetron (ZOFRAN) injection 4 mg (4 mg Intravenous Given 05/06/19 1407)    ED Course  I have reviewed the triage vital signs and the nursing notes.  Pertinent labs & imaging results that were available during my care of the patient were reviewed by me and considered in my medical decision making (see chart for details).  Patient seen and examined. Work-up initiated. Medications ordered.    Vital signs reviewed and are as follows: BP 121/75 (BP Location: Left Arm)   Pulse 85   Temp 98 F (  36.7 C) (Oral)   Resp 17   Ht 5\' 4"  (1.626 m)   Wt 61.2 kg   SpO2 100%   BMI 23.17 kg/m   1:49 PM + hip fracture. Labs, prn pain meds ordered.   2:15 PM Spoke with patient's daughter by telephone.   2:18 PM Spoke with Dr. who will see patient.   3:33 PM Dr. Susa Simmonds to see.   CRITICAL CARE Performed by: Butler Denmark PA-C Total critical care time: 40 minutes Critical care time was exclusive of separately billable procedures and treating other patients. Critical care was necessary to treat or prevent imminent or life-threatening deterioration. Critical care was time spent personally by me on the following activities: development of treatment plan with patient and/or surrogate as well as nursing, discussions with consultants, evaluation of patient's response to treatment, examination of patient, obtaining history from patient or surrogate, ordering and performing treatments and interventions, ordering and review of laboratory studies, ordering and review of radiographic studies, pulse oximetry and re-evaluation of patient's condition.     MDM Rules/Calculators/A&P                      Admit.    Final Clinical Impression(s) / ED Diagnoses Final diagnoses:  Closed intertrochanteric fracture of hip, left, initial encounter [facility])  Atrial fibrillation, unspecified type [facility])    Rx / DC Orders ED Discharge Orders    None       [facility], PA-C 05/06/19 1533    05/08/19, MD 05/11/19 1551

## 2019-05-06 NOTE — ED Notes (Signed)
Pt transported for imaging. 

## 2019-05-06 NOTE — Consult Note (Signed)
Brief orthopedic consult note.   Full consult note to follow.  Tiffany [name] is a 86 year old female status post fall with left intertrochanteric hip fracture.  Orthopedics was called for evaluation.  I reviewed the images.  I have ordered full-length femur films.  Patient is ambulatory at baseline per emergency department PA.  She is indicated for cephalo-medullary nail fixation of her left intertrochanteric hip fracture.  We will plan for surgery on 05/07/2019.Recommend admission to the hospitalist team for presurgical optimization.

## 2019-05-06 NOTE — Plan of Care (Signed)
  Problem: Education: Goal: Knowledge of General Education information will improve Description Including pain rating scale, medication(s)/side effects and non-pharmacologic comfort measures Outcome: Progressing   

## 2019-05-06 NOTE — ED Triage Notes (Signed)
Arrives via EMS from home, C/C fall yesterday, normally uses a walker, complains of L hip pain. Patient lives with her daughter.  100 mg Fentanyl given IV 20 L Hand by EMS.

## 2019-05-06 NOTE — H&P (Signed)
History and Physical    Tiffany [name]  RXV:[id number]  DOB: [birth year]  DOA: 05/06/2019 PCP: System, Pcp Not In   Patient coming from: home  Chief Complaint: fall and L hip pain  HPI: Tiffany [name] is a 86 y.o. female with medical history of anemia who fell at home yesterday and presents for pain in the left hip today.   ED Course: given Fentanyl 100 mg IV  Review of Systems:  All other systems reviewed and apart from HPI, are negative.  Past Medical History:  Diagnosis Date  . Anemia     Past Surgical History:  Procedure Laterality Date  . ABDOMINAL HYSTERECTOMY      Social History:   reports that she has never smoked. She has never used smokeless tobacco. She reports that she does not drink alcohol or use drugs.  No Known Allergies  Family History  Problem Relation Age of Onset  . Hypertension Other      Prior to Admission medications   Not on File    Physical Exam: Wt Readings from Last 3 Encounters:  05/06/19 61.2 kg  12/12/16 75.3 kg  06/28/14 79.4 kg   Vitals:   05/06/19 1249 05/06/19 1251 05/06/19 1419 05/06/19 1624  BP:  121/75 111/77 98/74  Pulse:  85 93 99  Resp:  17 18 19   Temp:  98 F (36.7 C)    TempSrc:  Oral    SpO2:  100% 97% 95%  Weight: 61.2 kg     Height: 5\' 4"  (1.626 m)         Constitutional:  Calm & comfortable Eyes: PERRLA, lids and conjunctivae normal ENT:  Mucous membranes are moist.  Pharynx clear of exudate   Has dentures- very hard of hearing Neck: Supple, no masses  Respiratory:  Clear to auscultation bilaterally  Normal respiratory effort.  Cardiovascular:  S1 & S2 heard, regular rate and rhythm No Murmurs Abdomen:  Non distended No tenderness, No masses Bowel sounds normal Extremities:  No clubbing / cyanosis No pedal edema No joint deformity    Skin:  No rashes, lesions or ulcers Neurologic:  Oriented only to person CN 2-12 grossly intact Sensation intact Strength 5/5 in all 4  extremities Psychiatric:  Normal Mood and affect    Labs on Admission: I have personally reviewed following labs and imaging studies  CBC: Recent Labs  Lab 05/06/19 1411  WBC 8.1  NEUTROABS 5.0  HGB 10.0*  HCT 31.7*  MCV 103.3*  PLT 509*   Basic Metabolic Panel: Recent Labs  Lab 05/06/19 1411  NA 139  K 3.9  CL 105  CO2 27  GLUCOSE 111*  BUN 32*  CREATININE 0.68  CALCIUM 8.9   GFR: Estimated Creatinine Clearance: 37.9 mL/min (by C-G formula based on SCr of 0.68 mg/dL). Liver Function Tests: No results for input(s): AST, ALT, ALKPHOS, BILITOT, PROT, ALBUMIN in the last 168 hours. No results for input(s): LIPASE, AMYLASE in the last 168 hours. No results for input(s): AMMONIA in the last 168 hours. Coagulation Profile: Recent Labs  Lab 05/06/19 1411  INR 1.2   Cardiac Enzymes: No results for input(s): CKTOTAL, CKMB, CKMBINDEX, TROPONINI in the last 168 hours. BNP (last 3 results) No results for input(s): PROBNP in the last 8760 hours. HbA1C: No results for input(s): HGBA1C in the last 72 hours. CBG: No results for input(s): GLUCAP in the last 168 hours. Lipid Profile: No results for input(s): CHOL, HDL, LDLCALC, TRIG, CHOLHDL, LDLDIRECT in the last  72 hours. Thyroid Function Tests: No results for input(s): TSH, T4TOTAL, FREET4, T3FREE, THYROIDAB in the last 72 hours. Anemia Panel: No results for input(s): VITAMINB12, FOLATE, FERRITIN, TIBC, IRON, RETICCTPCT in the last 72 hours. Urine analysis:    Component Value Date/Time   COLORURINE YELLOW 12/11/2016 2347   APPEARANCEUR CLEAR 12/11/2016 2347   LABSPEC 1.010 12/11/2016 2347   PHURINE 6.0 12/11/2016 2347   GLUCOSEU NEGATIVE 12/11/2016 2347   HGBUR SMALL (A) 12/11/2016 2347   BILIRUBINUR NEGATIVE 12/11/2016 2347   KETONESUR NEGATIVE 12/11/2016 2347   PROTEINUR NEGATIVE 12/11/2016 2347   NITRITE NEGATIVE 12/11/2016 2347   LEUKOCYTESUR NEGATIVE 12/11/2016 2347   Sepsis  Labs: @LABRCNTIP (procalcitonin:4,lacticidven:4) ) Recent Results (from the past 240 hour(s))  Respiratory Panel by RT PCR (Flu A&B, Covid) - Nasopharyngeal Swab     Status: None   Collection Time: 05/06/19  2:50 PM   Specimen: Nasopharyngeal Swab  Result Value Ref Range Status   SARS Coronavirus 2 by RT PCR NEGATIVE NEGATIVE Final    Comment: (NOTE) SARS-CoV-2 target nucleic acids are NOT DETECTED. The SARS-CoV-2 RNA is generally detectable in upper respiratoy specimens during the acute phase of infection. The lowest concentration of SARS-CoV-2 viral copies this assay can detect is 131 copies/mL. A negative result does not preclude SARS-Cov-2 infection and should not be used as the sole basis for treatment or other patient management decisions. A negative result may occur with  improper specimen collection/handling, submission of specimen other than nasopharyngeal swab, presence of viral mutation(s) within the areas targeted by this assay, and inadequate number of viral copies (<131 copies/mL). A negative result must be combined with clinical observations, patient history, and epidemiological information. The expected result is Negative. Fact Sheet for Patients:  05/08/19 Fact Sheet for Healthcare Providers:  https://www.moore.com/ This test is not yet ap proved or cleared by the https://www.young.biz/ FDA and  has been authorized for detection and/or diagnosis of SARS-CoV-2 by FDA under an Emergency Use Authorization (EUA). This EUA will remain  in effect (meaning this test can be used) for the duration of the COVID-19 declaration under Section 564(b)(1) of the Act, 21 U.S.C. section 360bbb-3(b)(1), unless the authorization is terminated or revoked sooner.    Influenza A by PCR NEGATIVE NEGATIVE Final   Influenza B by PCR NEGATIVE NEGATIVE Final    Comment: (NOTE) The Xpert Xpress SARS-CoV-2/FLU/RSV assay is intended as an aid in  the  diagnosis of influenza from Nasopharyngeal swab specimens and  should not be used as a sole basis for treatment. Nasal washings and  aspirates are unacceptable for Xpert Xpress SARS-CoV-2/FLU/RSV  testing. Fact Sheet for Patients: [place] Fact Sheet for Healthcare Providers: https://www.moore.com/ This test is not yet approved or cleared by the https://www.young.biz/ FDA and  has been authorized for detection and/or diagnosis of SARS-CoV-2 by  FDA under an Emergency Use Authorization (EUA). This EUA will remain  in effect (meaning this test can be used) for the duration of the  Covid-19 declaration under Section 564(b)(1) of the Act, 21  U.S.C. section 360bbb-3(b)(1), unless the authorization is  terminated or revoked. Performed at [facility], [address] [place], [place]      Radiological Exams on Admission: DG Chest Port 1 View  Result Date: 05/06/2019 CLINICAL DATA:  Preop for LEFT hip fracture EXAM: PORTABLE CHEST 1 VIEW COMPARISON:  Hip x-ray 05/06/2019 FINDINGS: Stable enlarged cardiac silhouette. Aorta is ectatic. No effusion, infiltrate pneumothorax. No acute osseous abnormality. IMPRESSION: No acute  cardiopulmonary process. Electronically Signed   By: Genevive Bi M.D.   On: 05/06/2019 14:01   DG Hip Unilat W or Wo Pelvis 2-3 Views Left  Result Date: 05/06/2019 CLINICAL DATA:  Diffuse left hip pain.  Fall EXAM: DG HIP (WITH OR WITHOUT PELVIS) 2-3V LEFT COMPARISON:  None. FINDINGS: There is a left femoral intertrochanteric fracture with varus angulation and displacement. No subluxation or dislocation. IMPRESSION: Left femoral intertrochanteric fracture with displacement and varus angulation. Electronically Signed   By: Charlett Nose M.D.   On: 05/06/2019 13:28   DG FEMUR MIN 2 VIEWS LEFT  Result Date: 05/06/2019 CLINICAL DATA:  Preop for intramedullary nail placement. EXAM: LEFT FEMUR 2 VIEWS  COMPARISON:  May 06, 2019 FINDINGS: There are advanced degenerative changes of the left knee. Evaluation of the left knee and distal left femur is limited by patient positioning. There is diffuse osteopenia. Again noted is a displaced comminuted fracture of the proximal left femur as before. IMPRESSION: 1. Acute displaced fracture of the proximal left femur as before. 2. Suboptimal evaluation of the distal left femur and left knee secondary to patient positioning. There are degenerative changes of the left knee without definite evidence for displaced fracture. 3. Osteopenia. Electronically Signed   By: Katherine Mantle M.D.   On: 05/06/2019 14:55    EKG: Independently reviewed. A-fib w rate of 84  Assessment/Plan Active Problems:   S/p left hip fracture - Dr Susa Simmonds plans to repair tomorrow    New onset a-fib [facility])  - rate controlled- cont to follow on telemetry - will order TFTs and ECHO to complete the work up  Dementia?? Oriented only to person - follow for behavioral disturbance/ sundowning   DVT prophylaxis: Lovenox  Code Status: Full code  Family Communication:   Disposition Plan: from home-   Consults called: ortho  Admission status: inpatient    Calvert Cantor MD [facility] Pager: [email or web address] Password [email or web address] 7PM-7AM, please contact night-coverage   05/06/2019, 5:32 PM

## 2019-05-06 NOTE — Consult Note (Signed)
Reason for Consult: Left intertrochanteric hip fracture Referring Physician: [name]  Tiffany [name] is an 86 y.o. female.  HPI: Tiffany [name] is a 86 year old female who fell onto her left hip and had pain in her hip and was unable to ambulate.  She has a history of A. fib.  Patient was brought to the emergency department where x-rays revealed a displaced intertrochanteric hip fracture.  On questioning patient did not describe pain.  She was slightly confused.  I spoke with family member Tiffany [name] today.  She states that the patient ambulates some and shuffles about.  She fell in the bathroom.  Denies any recent fevers or chills.  Past Medical History:  Diagnosis Date  . Anemia     Past Surgical History:  Procedure Laterality Date  . ABDOMINAL HYSTERECTOMY      Family History  Problem Relation Age of Onset  . Hypertension Other     Social History:  reports that she has never smoked. She has never used smokeless tobacco. She reports that she does not drink alcohol or use drugs.  Allergies:  Allergies  Allergen Reactions  . Strawberry Flavor Hives and Rash    Strawberries    Medications: I have reviewed the patient's current medications.  Results for orders placed or performed during the hospital encounter of 05/06/19 (from the past 48 hour(s))  ABO/Rh     Status: None   Collection Time: 05/06/19  1:51 PM  Result Value Ref Range   ABO/RH(D)      A NEG Performed at [facility], [address], [place], [place] [address]   Basic metabolic panel     Status: Abnormal   Collection Time: 05/06/19  2:11 PM  Result Value Ref Range   Sodium 139 135 - 145 mmol/L   Potassium 3.9 3.5 - 5.1 mmol/L   Chloride 105 98 - 111 mmol/L   CO2 27 22 - 32 mmol/L   Glucose, Bld 111 (H) 70 - 99 mg/dL    Comment: Glucose reference range applies only to samples taken after fasting for at least 8 hours.   BUN 32 (H) 8 - 23 mg/dL   Creatinine, Ser 5.99 0.44 - 1.00  mg/dL   Calcium 8.9 8.9 - 35.7 mg/dL   GFR calc non Af Amer >60 >60 mL/min   GFR calc Af Amer >60 >60 mL/min   Anion gap 7 5 - 15    Comment: Performed at [facility], [address], [place], [place] [address]  CBC WITH DIFFERENTIAL     Status: Abnormal   Collection Time: 05/06/19  2:11 PM  Result Value Ref Range   WBC 8.1 4.0 - 10.5 K/uL   RBC 3.07 (L) 3.87 - 5.11 MIL/uL   Hemoglobin 10.0 (L) 12.0 - 15.0 g/dL   HCT 39.0 (L) 30.0 - 92.3 %   MCV 103.3 (H) 80.0 - 100.0 fL   MCH 32.6 26.0 - 34.0 pg   MCHC 31.5 30.0 - 36.0 g/dL   RDW 30.0 76.2 - 26.3 %   Platelets 128 (L) 150 - 400 K/uL   nRBC 0.0 0.0 - 0.2 %   Neutrophils Relative % 61 %   Neutro Abs 5.0 1.7 - 7.7 K/uL   Lymphocytes Relative 21 %   Lymphs Abs 1.7 0.7 - 4.0 K/uL   Monocytes Relative 17 %   Monocytes Absolute 1.3 (H) 0.1 - 1.0 K/uL   Eosinophils Relative 1 %   Eosinophils Absolute 0.1  0.0 - 0.5 K/uL   Basophils Relative 0 %   Basophils Absolute 0.0 0.0 - 0.1 K/uL   Immature Granulocytes 0 %   Abs Immature Granulocytes 0.03 0.00 - 0.07 K/uL    Comment: Performed at [facility], [address], [place], [place] [address]  Protime-INR     Status: None   Collection Time: 05/06/19  2:11 PM  Result Value Ref Range   Prothrombin Time 15.2 11.4 - 15.2 seconds   INR 1.2 0.8 - 1.2    Comment: (NOTE) INR goal varies based on device and disease states. Performed at [facility], [address], [place], [place] [address]   Type and screen [facility]     Status: None   Collection Time: 05/06/19  2:11 PM  Result Value Ref Range   ABO/RH(D) A NEG    Antibody Screen NEG    Sample Expiration      05/09/2019,2359 Performed at [facility], [address] [place], [place] [address]   Respiratory Panel by RT PCR (Flu A&B, Covid) - Nasopharyngeal Swab     Status: None   Collection Time: 05/06/19  2:50 PM   Specimen:  Nasopharyngeal Swab  Result Value Ref Range   SARS Coronavirus 2 by RT PCR NEGATIVE NEGATIVE    Comment: (NOTE) SARS-CoV-2 target nucleic acids are NOT DETECTED. The SARS-CoV-2 RNA is generally detectable in upper respiratoy specimens during the acute phase of infection. The lowest concentration of SARS-CoV-2 viral copies this assay can detect is 131 copies/mL. A negative result does not preclude SARS-Cov-2 infection and should not be used as the sole basis for treatment or other patient management decisions. A negative result may occur with  improper specimen collection/handling, submission of specimen other than nasopharyngeal swab, presence of viral mutation(s) within the areas targeted by this assay, and inadequate number of viral copies (<131 copies/mL). A negative result must be combined with clinical observations, patient history, and epidemiological information. The expected result is Negative. Fact Sheet for Patients:  https://www.moore.com/ Fact Sheet for Healthcare Providers:  https://www.young.biz/ This test is not yet ap proved or cleared by the [place] FDA and  has been authorized for detection and/or diagnosis of SARS-CoV-2 by FDA under an Emergency Use Authorization (EUA). This EUA will remain  in effect (meaning this test can be used) for the duration of the COVID-19 declaration under Section 564(b)(1) of the Act, 21 U.S.C. section 360bbb-3(b)(1), unless the authorization is terminated or revoked sooner.    Influenza A by PCR NEGATIVE NEGATIVE   Influenza B by PCR NEGATIVE NEGATIVE    Comment: (NOTE) The Xpert Xpress SARS-CoV-2/FLU/RSV assay is intended as an aid in  the diagnosis of influenza from Nasopharyngeal swab specimens and  should not be used as a sole basis for treatment. Nasal washings and  aspirates are unacceptable for Xpert Xpress SARS-CoV-2/FLU/RSV  testing. Fact Sheet for  Patients: https://www.moore.com/ Fact Sheet for Healthcare Providers: https://www.young.biz/ This test is not yet approved or cleared by the [place] FDA and  has been authorized for detection and/or diagnosis of SARS-CoV-2 by  FDA under an Emergency Use Authorization (EUA). This EUA will remain  in effect (meaning this test can be used) for the duration of the  Covid-19 declaration under Section 564(b)(1) of the Act, 21  U.S.C. section 360bbb-3(b)(1), unless the authorization is  terminated or revoked. Performed at [facility], [address] [place], [place] [address]  DG Chest Port 1 View  Result Date: 05/06/2019 CLINICAL DATA:  Preop for LEFT hip fracture EXAM: PORTABLE CHEST 1 VIEW COMPARISON:  Hip x-ray 05/06/2019 FINDINGS: Stable enlarged cardiac silhouette. Aorta is ectatic. No effusion, infiltrate pneumothorax. No acute osseous abnormality. IMPRESSION: No acute cardiopulmonary process. Electronically Signed   By: Genevive Bi M.D.   On: 05/06/2019 14:01   DG Hip Unilat W or Wo Pelvis 2-3 Views Left  Result Date: 05/06/2019 CLINICAL DATA:  Diffuse left hip pain.  Fall EXAM: DG HIP (WITH OR WITHOUT PELVIS) 2-3V LEFT COMPARISON:  None. FINDINGS: There is a left femoral intertrochanteric fracture with varus angulation and displacement. No subluxation or dislocation. IMPRESSION: Left femoral intertrochanteric fracture with displacement and varus angulation. Electronically Signed   By: Charlett Nose M.D.   On: 05/06/2019 13:28   DG FEMUR MIN 2 VIEWS LEFT  Result Date: 05/06/2019 CLINICAL DATA:  Preop for intramedullary nail placement. EXAM: LEFT FEMUR 2 VIEWS COMPARISON:  May 06, 2019 FINDINGS: There are advanced degenerative changes of the left knee. Evaluation of the left knee and distal left femur is limited by patient positioning. There is diffuse osteopenia. Again noted is a displaced comminuted fracture of the  proximal left femur as before. IMPRESSION: 1. Acute displaced fracture of the proximal left femur as before. 2. Suboptimal evaluation of the distal left femur and left knee secondary to patient positioning. There are degenerative changes of the left knee without definite evidence for displaced fracture. 3. Osteopenia. Electronically Signed   By: Katherine Mantle M.D.   On: 05/06/2019 14:55    Review of Systems  Constitutional: Negative.   HENT: Negative.   Eyes: Negative.   Respiratory: Negative.   Cardiovascular: Negative.   Gastrointestinal: Negative.   Musculoskeletal:       Left hip pain  Neurological: Negative.   Hematological: Negative.   Psychiatric/Behavioral: Negative.    Blood pressure 101/65, pulse 64, temperature 97.8 F (36.6 C), temperature source Oral, resp. rate 18, height 5\' 4"  (1.626 m), weight 61.2 kg, SpO2 92 %. Physical Exam  Constitutional: She appears well-developed.  HENT:  Head: Normocephalic.  Eyes: Conjunctivae are normal.  Cardiovascular: Normal rate.  Respiratory: Effort normal.  GI: Soft.  Musculoskeletal:     Cervical back: Neck supple.     Comments: Patient has pain in the left hip.  Her left lower extremity is shortened.  She is laying on her side with her knees drawn up.  She denies right lower extremity symptoms.  She has swelling to bilateral legs.  Some evidence of venous stasis changes about bilateral legs.  She endorses sensation to light touch about her legs.  No evidence of upper extremity injury.  Neurological: She is alert.  Pleasantly demented.  Skin: Skin is warm.  Psychiatric: She has a normal mood and affect.    Assessment/Plan: Patient has a displaced left intertrochanteric hip fracture.  My recommendation based on her ambulatory status and overall health would be for cephalomedullary nail fixation.  Patient will be made n.p.o. past midnight in anticipation of surgery tomorrow.  She is being admitted to the hospitalist team and  echocardiogram has been ordered.  For now she is on bedrest.  I had a lengthy conversation with the patient's family member discussing the risk, benefits alternatives of surgery which include but not limited to wound healing complications, infection, nonunion, malunion, need for further surgery, decrease in overall ambulatory function and continued pain.  We also discussed the 1 year mortality rate  with hip fractures.  After weighing these risks he would like to proceed with surgery.  Erle Crocker 05/06/2019, 7:46 PM

## 2019-05-07 ENCOUNTER — Inpatient Hospital Stay (HOSPITAL_COMMUNITY): Payer: Medicare Other

## 2019-05-07 ENCOUNTER — Inpatient Hospital Stay (HOSPITAL_COMMUNITY): Payer: Medicare Other | Admitting: Certified Registered Nurse Anesthetist

## 2019-05-07 ENCOUNTER — Encounter (HOSPITAL_COMMUNITY)
Admission: EM | Disposition: A | Discharge status: Hospice | Payer: Self-pay | Source: Home / Self Care | Attending: Pulmonary Disease

## 2019-05-07 ENCOUNTER — Encounter (HOSPITAL_COMMUNITY): Payer: Self-pay | Admitting: Internal Medicine

## 2019-05-07 DIAGNOSIS — I340 Nonrheumatic mitral (valve) insufficiency: Secondary | ICD-10-CM

## 2019-05-07 DIAGNOSIS — I351 Nonrheumatic aortic (valve) insufficiency: Secondary | ICD-10-CM

## 2019-05-07 HISTORY — PX: INTRAMEDULLARY (IM) NAIL INTERTROCHANTERIC: SHX5875

## 2019-05-07 LAB — BASIC METABOLIC PANEL
Anion gap: 7 (ref 5–15)
Anion gap: 12 (ref 5–15)
BUN: 27 mg/dL — ABNORMAL HIGH (ref 8–23)
BUN: 24 mg/dL — ABNORMAL HIGH (ref 8–23)
CO2: 28 mmol/L (ref 22–32)
CO2: 25 mmol/L (ref 22–32)
Calcium: 8.6 mg/dL — ABNORMAL LOW (ref 8.9–10.3)
Calcium: 8.4 mg/dL — ABNORMAL LOW (ref 8.9–10.3)
Chloride: 104 mmol/L (ref 98–111)
Chloride: 104 mmol/L (ref 98–111)
Creatinine, Ser: 0.69 mg/dL (ref 0.44–1.00)
Creatinine, Ser: 0.77 mg/dL (ref 0.44–1.00)
GFR calc Af Amer: >60 mL/min (ref >60)
GFR calc Af Amer: >60 mL/min (ref >60)
GFR calc non Af Amer: >60 mL/min (ref >60)
GFR calc non Af Amer: >60 mL/min (ref >60)
Glucose, Bld: 101 mg/dL — ABNORMAL HIGH (ref 70–99)
Glucose, Bld: 169 mg/dL — ABNORMAL HIGH (ref 70–99)
Potassium: 3.8 mmol/L (ref 3.5–5.1)
Potassium: 4.4 mmol/L (ref 3.5–5.1)
Sodium: 139 mmol/L (ref 135–145)
Sodium: 141 mmol/L (ref 135–145)

## 2019-05-07 LAB — CBC
HCT: 27.7 % — ABNORMAL LOW (ref 36.0–46.0)
Hemoglobin: 9 g/dL — ABNORMAL LOW (ref 12.0–15.0)
MCH: 33.3 pg (ref 26.0–34.0)
MCHC: 32.5 g/dL (ref 30.0–36.0)
MCV: 102.6 fL — ABNORMAL HIGH (ref 80.0–100.0)
Platelets: 124 10*3/uL — ABNORMAL LOW (ref 150–400)
RBC: 2.7 MIL/uL — ABNORMAL LOW (ref 3.87–5.11)
RDW: 13.2 % (ref 11.5–15.5)
WBC: 7.4 10*3/uL (ref 4.0–10.5)
nRBC: 0 % (ref 0.0–0.2)

## 2019-05-07 LAB — SURGICAL PCR SCREEN
MRSA, PCR: NEGATIVE
Staphylococcus aureus: POSITIVE — AB

## 2019-05-07 LAB — ECHOCARDIOGRAM COMPLETE
Height: 64 in
Weight: 2160 oz

## 2019-05-07 LAB — T4, FREE: Free T4: 0.94 ng/dL (ref 0.61–1.12)

## 2019-05-07 LAB — TSH: TSH: 1.361 u[IU]/mL (ref 0.350–4.500)

## 2019-05-07 LAB — PREPARE RBC (CROSSMATCH)

## 2019-05-07 SURGERY — FIXATION, FRACTURE, INTERTROCHANTERIC, WITH INTRAMEDULLARY ROD
Anesthesia: General | Laterality: Left

## 2019-05-07 MED ORDER — SUGAMMADEX SODIUM 200 MG/2ML IV SOLN
INTRAVENOUS | Status: DC | PRN
  Administered 2019-05-07: 150 mg via INTRAVENOUS

## 2019-05-07 MED ORDER — 0.9 % SODIUM CHLORIDE (POUR BTL) OPTIME
TOPICAL | Status: DC | PRN
  Administered 2019-05-07: 1000 mL

## 2019-05-07 MED ORDER — ALBUMIN HUMAN 5 % IV SOLN: INTRAVENOUS | Status: DC | PRN

## 2019-05-07 MED ORDER — CEFAZOLIN SODIUM-DEXTROSE 1-4 GM/50ML-% IV SOLN
1.0000 g | Freq: Three times a day (TID) | INTRAVENOUS | Status: AC
  Administered 2019-05-07 – 2019-05-08 (×3): 1 g via INTRAVENOUS
  Filled 2019-05-07 (×3): qty 50

## 2019-05-07 MED ORDER — ONDANSETRON HCL 4 MG/2ML IJ SOLN
INTRAMUSCULAR | Status: AC
  Filled 2019-05-07: qty 2

## 2019-05-07 MED ORDER — METOPROLOL TARTRATE 5 MG/5ML IV SOLN
5.0000 mg | INTRAVENOUS | Status: DC | PRN
  Administered 2019-05-07 (×2): 5 mg via INTRAVENOUS
  Filled 2019-05-07 (×3): qty 5

## 2019-05-07 MED ORDER — FENTANYL CITRATE (PF) 100 MCG/2ML IJ SOLN
INTRAMUSCULAR | Status: AC
  Filled 2019-05-07: qty 2

## 2019-05-07 MED ORDER — FENTANYL CITRATE (PF) 100 MCG/2ML IJ SOLN: 25.0000 ug | INTRAMUSCULAR | Status: DC | PRN

## 2019-05-07 MED ORDER — FENTANYL CITRATE (PF) 100 MCG/2ML IJ SOLN
INTRAMUSCULAR | Status: DC | PRN
  Administered 2019-05-07: 50 ug via INTRAVENOUS
  Administered 2019-05-07 (×2): 25 ug via INTRAVENOUS

## 2019-05-07 MED ORDER — LACTATED RINGERS IV SOLN
INTRAVENOUS | Status: DC
  Administered 2019-05-07: 20:00:00 10 mL/h via INTRAVENOUS

## 2019-05-07 MED ORDER — ONDANSETRON HCL 4 MG PO TABS: 4.0000 mg | ORAL_TABLET | Freq: Four times a day (QID) | ORAL | Status: DC | PRN

## 2019-05-07 MED ORDER — PROPOFOL 10 MG/ML IV BOLUS
INTRAVENOUS | Status: DC | PRN
  Administered 2019-05-07: 30 mg via INTRAVENOUS
  Administered 2019-05-07: 40 mg via INTRAVENOUS

## 2019-05-07 MED ORDER — ROCURONIUM BROMIDE 100 MG/10ML IV SOLN
INTRAVENOUS | Status: DC | PRN
  Administered 2019-05-07: 40 mg via INTRAVENOUS

## 2019-05-07 MED ORDER — ONDANSETRON HCL 4 MG/2ML IJ SOLN: 4.0000 mg | Freq: Once | INTRAMUSCULAR | Status: DC | PRN

## 2019-05-07 MED ORDER — INFLUENZA VAC A&B SA ADJ QUAD 0.5 ML IM PRSY
0.5000 mL | PREFILLED_SYRINGE | INTRAMUSCULAR | Status: DC
  Filled 2019-05-07: qty 0.5

## 2019-05-07 MED ORDER — PHENYLEPHRINE HCL-NACL 10-0.9 MG/250ML-% IV SOLN
INTRAVENOUS | Status: DC | PRN
  Administered 2019-05-07: 50 ug/min via INTRAVENOUS

## 2019-05-07 MED ORDER — ONDANSETRON HCL 4 MG/2ML IJ SOLN
INTRAMUSCULAR | Status: DC | PRN
  Administered 2019-05-07: 4 mg via INTRAVENOUS

## 2019-05-07 MED ORDER — CHLORHEXIDINE GLUCONATE CLOTH 2 % EX PADS
6.0000 | MEDICATED_PAD | Freq: Every day | CUTANEOUS | Status: AC
  Administered 2019-05-07 – 2019-05-11 (×6): 6 via TOPICAL

## 2019-05-07 MED ORDER — DEXAMETHASONE SODIUM PHOSPHATE 10 MG/ML IJ SOLN
INTRAMUSCULAR | Status: AC
  Filled 2019-05-07: qty 1

## 2019-05-07 MED ORDER — LIDOCAINE HCL (CARDIAC) PF 100 MG/5ML IV SOSY
PREFILLED_SYRINGE | INTRAVENOUS | Status: DC | PRN
  Administered 2019-05-07: 100 mg via INTRATRACHEAL

## 2019-05-07 MED ORDER — LACTATED RINGERS IV BOLUS
500.0000 mL | Freq: Once | INTRAVENOUS | Status: AC
  Administered 2019-05-07: 500 mL via INTRAVENOUS

## 2019-05-07 MED ORDER — ALBUMIN HUMAN 5 % IV SOLN
INTRAVENOUS | Status: AC
  Filled 2019-05-07: qty 250

## 2019-05-07 MED ORDER — DEXAMETHASONE SODIUM PHOSPHATE 10 MG/ML IJ SOLN
INTRAMUSCULAR | Status: DC | PRN
  Administered 2019-05-07: 4 mg via INTRAVENOUS

## 2019-05-07 MED ORDER — PHENYLEPHRINE HCL (PRESSORS) 10 MG/ML IV SOLN
INTRAVENOUS | Status: DC | PRN
  Administered 2019-05-07: 80 ug via INTRAVENOUS
  Administered 2019-05-07: 40 ug via INTRAVENOUS

## 2019-05-07 MED ORDER — MUPIROCIN 2 % EX OINT
1.0000 "application " | TOPICAL_OINTMENT | Freq: Two times a day (BID) | CUTANEOUS | Status: AC
  Administered 2019-05-07 – 2019-05-11 (×10): 1 via NASAL
  Filled 2019-05-07 (×2): qty 22

## 2019-05-07 MED ORDER — MEPERIDINE HCL 50 MG/ML IJ SOLN: 6.2500 mg | INTRAMUSCULAR | Status: DC | PRN

## 2019-05-07 MED ORDER — SODIUM CHLORIDE 0.9 % IV SOLN
INTRAVENOUS | Status: DC | PRN
  Administered 2019-05-08: 500 mL via INTRAVENOUS

## 2019-05-07 MED ORDER — METOCLOPRAMIDE HCL 5 MG PO TABS: 5.0000 mg | ORAL_TABLET | Freq: Three times a day (TID) | ORAL | Status: DC | PRN

## 2019-05-07 MED ORDER — METOPROLOL TARTRATE 5 MG/5ML IV SOLN
2.5000 mg | INTRAVENOUS | Status: AC | PRN
  Administered 2019-05-08: 2.5 mg via INTRAVENOUS
  Filled 2019-05-07: qty 5

## 2019-05-07 MED ORDER — SODIUM CHLORIDE 0.9 % IV SOLN: INTRAVENOUS | Status: DC | PRN

## 2019-05-07 MED ORDER — ONDANSETRON HCL 4 MG/2ML IJ SOLN: 4.0000 mg | Freq: Four times a day (QID) | INTRAMUSCULAR | Status: DC | PRN

## 2019-05-07 MED ORDER — METOCLOPRAMIDE HCL 5 MG/ML IJ SOLN: 5.0000 mg | Freq: Three times a day (TID) | INTRAMUSCULAR | Status: DC | PRN

## 2019-05-07 MED ORDER — DOCUSATE SODIUM 100 MG PO CAPS
100.0000 mg | ORAL_CAPSULE | Freq: Two times a day (BID) | ORAL | Status: DC
  Administered 2019-05-08 – 2019-05-12 (×5): 100 mg via ORAL
  Filled 2019-05-07 (×7): qty 1

## 2019-05-07 SURGICAL SUPPLY — 50 items
AFFIXUS HIP FRACTURE NAIL IN CAP FLUSH IMPINGING ×3 IMPLANT
BIT DRILL 4.3MMS DISTAL GRDTED (BIT) ×1 IMPLANT
BIT DRILL CANN LG 4.3MM (BIT) ×1 IMPLANT
BOOTIES KNEE HIGH SLOAN (MISCELLANEOUS) ×9 IMPLANT
CABLE CERLAGE W/CRIMP 1.8 (Cable) ×6 IMPLANT
CABLE CERLAGE W/CRIMP 1.8MM (Cable) ×3 IMPLANT
CHLORAPREP W/TINT 26 (MISCELLANEOUS) ×6 IMPLANT
COVER PERINEAL POST (MISCELLANEOUS) ×3 IMPLANT
COVER SURGICAL LIGHT HANDLE (MISCELLANEOUS) ×3 IMPLANT
COVER WAND RF STERILE (DRAPES) ×3 IMPLANT
DRAPE C-ARM 42X120 X-RAY (DRAPES) ×3 IMPLANT
DRAPE C-ARMOR (DRAPES) ×3 IMPLANT
DRAPE IMP U-DRAPE 54X76 (DRAPES) ×6 IMPLANT
DRAPE ORTHO SPLIT 77X108 STRL (DRAPES) ×6
DRAPE STERI IOBAN 125X83 (DRAPES) ×3 IMPLANT
DRAPE SURG ORHT 6 SPLT 77X108 (DRAPES) ×2 IMPLANT
DRAPE TOP 10253 STERILE (DRAPES) ×6 IMPLANT
DRILL 4.3MMS DISTAL GRADUATED (BIT) ×3
DRILL BIT CANN LG 4.3MM (BIT) ×3
DRSG AQUACEL AG ADV 3.5X 4 (GAUZE/BANDAGES/DRESSINGS) ×3 IMPLANT
DRSG AQUACEL AG ADV 3.5X14 (GAUZE/BANDAGES/DRESSINGS) ×3 IMPLANT
DRSG MEPILEX BORDER 4X4 (GAUZE/BANDAGES/DRESSINGS) IMPLANT
DRSG PAD ABDOMINAL 8X10 ST (GAUZE/BANDAGES/DRESSINGS) ×6 IMPLANT
ELECT REM PT RETURN 15FT ADLT (MISCELLANEOUS) ×6 IMPLANT
FACESHIELD WRAPAROUND (MASK) ×3 IMPLANT
GLOVE BIOGEL M STRL SZ7.5 (GLOVE) ×6 IMPLANT
GLOVE BIOGEL PI IND STRL 8 (GLOVE) ×2 IMPLANT
GLOVE BIOGEL PI INDICATOR 8 (GLOVE) ×4
GOWN STRL REUS W/TWL XL LVL3 (GOWN DISPOSABLE) ×15 IMPLANT
GUIDEPIN 3.2X17.5 THRD DISP (PIN) ×6 IMPLANT
GUIDEWIRE BALL NOSE 80CM (WIRE) ×3 IMPLANT
HFN IN CAP FLUSH IMPINGING (MISCELLANEOUS) ×3 IMPLANT
KIT BASIN OR (CUSTOM PROCEDURE TRAY) ×3 IMPLANT
NAIL HIP FX LEFT 13X340MM-130 (Nail) ×3 IMPLANT
NS IRRIG 1000ML POUR BTL (IV SOLUTION) ×3 IMPLANT
PACK GENERAL/GYN (CUSTOM PROCEDURE TRAY) ×3 IMPLANT
PENCIL SMOKE EVACUATOR (MISCELLANEOUS) IMPLANT
PROTECTOR NERVE ULNAR (MISCELLANEOUS) ×9 IMPLANT
SCREW ANTI-ROTATION 75MM (Screw) ×3 IMPLANT
SCREW BONE CORTICAL 5.0X40 (Screw) ×3 IMPLANT
SCREW BONE CORTICAL 5.0X42 (Screw) ×3 IMPLANT
SCREW DRILL BIT ANIT ROTATION (BIT) ×3 IMPLANT
SCREW LAG HIP NAIL 10.5X95 (Screw) ×3 IMPLANT
SCREWDRIVER HEX TIP 3.5MM (MISCELLANEOUS) ×3 IMPLANT
STAPLER VISISTAT 35W (STAPLE) IMPLANT
STOCKINETTE 8 INCH (MISCELLANEOUS) ×3 IMPLANT
SUT MNCRL AB 3-0 PS2 18 (SUTURE) ×3 IMPLANT
SUT PDS AB 2-0 CT2 27 (SUTURE) ×3 IMPLANT
TOWEL OR 17X26 10 PK STRL BLUE (TOWEL DISPOSABLE) ×6 IMPLANT
WATER STERILE IRR 1000ML POUR (IV SOLUTION) ×6 IMPLANT

## 2019-05-07 NOTE — Transfer of Care (Signed)
Immediate Anesthesia Transfer of Care Note  Patient: Tiffany [name]  Procedure(s) Performed: INTRAMEDULLARY (IM) NAIL INTERTROCHANTRIC (Left )  Patient Location: PACU  Anesthesia Type:General  Level of Consciousness: patient cooperative and responds to stimulation  Airway & Oxygen Therapy: Patient Spontanous Breathing and Patient connected to face mask oxygen  Post-op Assessment: Report given to RN and Post -op Vital signs reviewed and stable  Post vital signs: Reviewed and stable  Last Vitals:  Vitals Value Taken Time  BP 94/60 05/07/19 1921  Temp    Pulse 43 05/07/19 1922  Resp 12 05/07/19 1921  SpO2 91 % 05/07/19 1922  Vitals shown include unvalidated device data.  Last Pain:  Vitals:   05/07/19 1427  TempSrc: Oral  PainSc:          Complications: No apparent anesthesia complications

## 2019-05-07 NOTE — Brief Op Note (Signed)
05/06/2019 - 05/07/2019  7:07 PM  PATIENT:  Tiffany [name]  86 y.o. female  PRE-OPERATIVE DIAGNOSIS:  LEFT INTERTROCHANTERIC FRACTURE  POST-OPERATIVE DIAGNOSIS:  LEFT INTERTROCHANTERIC FRACTURE  PROCEDURE:   Open reduction internal fixation left proximal femur hip fracture  SURGEON:  Surgeon(s) and Role:    Terance Hart, MD - Primary    * Gean Birchwood, MD - Assisting  PHYSICIAN ASSISTANT:   ASSISTANTS: Gean Birchwood, MD   ANESTHESIA:   general  EBL:  700 mL   BLOOD ADMINISTERED:2 units CC PRBC  DRAINS: none   LOCAL MEDICATIONS USED:  NONE  SPECIMEN:  No Specimen  DISPOSITION OF SPECIMEN:  N/A  COUNTS:  YES  TOURNIQUET:  * No tourniquets in log *  DICTATION: .Dragon Dictation  PLAN OF CARE: Transfer to ICU  PATIENT DISPOSITION:  PACU - hemodynamically stable.   Delay start of Pharmacological VTE agent (>24hrs) due to surgical blood loss or risk of bleeding: yes

## 2019-05-07 NOTE — Progress Notes (Signed)
CRITICAL VALUE ALERT  Critical Value:  Surgical PCR + Staph Aureus  Date & Time Notied:  05/07/19 1043  Provider Notified:   Orders Received/Actions taken: Standing order set started

## 2019-05-07 NOTE — Anesthesia Postprocedure Evaluation (Addendum)
Anesthesia Post Note  Patient: Tiffany [name]  Procedure(s) Performed: INTRAMEDULLARY (IM) NAIL INTERTROCHANTRIC (Left )     Patient location during evaluation: PACU Anesthesia Type: General Level of consciousness: sedated and patient cooperative Pain management: pain level controlled Vital Signs Assessment: post-procedure vital signs reviewed and stable Respiratory status: spontaneous breathing Cardiovascular status: stable Anesthetic complications: no Comments: Discussed with Dr. Susa Simmonds ICU admission postop given her blood loss, pressor requirement, prolonged surgery, and severely depressed LVSF. Dr. Susa Simmonds agreed to have her admitted to ICU.    Last Vitals:  Vitals:   05/07/19 2008 05/07/19 2015  BP: 93/64 (!) 93/57  Pulse:    Resp: 14 13  Temp:    SpO2:  100%    Last Pain:  Vitals:   05/07/19 1427  TempSrc: Oral  PainSc:                  Lewie Loron

## 2019-05-07 NOTE — Progress Notes (Signed)
eLink Physician-Brief Progress Note Patient Name: Tiffany [name] DOB: [birth year] MRN: [id number]   Date of Service  05/07/2019  HPI/Events of Note  Afib with RVR to 150-160 but BP improved because cause is patient agitation/pain.  CXR done and reviewed. Very rotated but no evidence of significant volume overload (no pulm edema or pleural effusions).   eICU Interventions  Give 500cc LR and morphine 2mg  IV. Once bolus is finished and morphine given, call eICU with update on HR for possible rate control orders if still needed.     Intervention Category Major Interventions: Arrhythmia - evaluation and management  [name] 05/07/2019, 10:11 PM

## 2019-05-07 NOTE — Progress Notes (Signed)
  Echocardiogram 2D Echocardiogram has been performed.  Leta Jungling M 05/07/2019, 10:20 AM

## 2019-05-07 NOTE — Anesthesia Preprocedure Evaluation (Addendum)
Anesthesia Evaluation  Patient identified by MRN, date of birth, ID band Patient confused    Reviewed: Allergy & Precautions, NPO status , Patient's Chart, lab work & pertinent test results  Airway Mallampati: II   Neck ROM: Limited   Comment: Pt confused. Difficult exam Dental  (+) Missing   Pulmonary neg pulmonary ROS,    - rhonchi + decreased breath sounds(-) wheezing      Cardiovascular +CHF  + Valvular Problems/Murmurs  Rhythm:Irregular Rate:Tachycardia  Echo 05/07/2019 1. Difficult to assess EF due to atrial fibrillation with RVR, but appears severely reduced 20-25%. Would recommend to control rates and re-check EF.  2. Left ventricular ejection fraction, by estimation, is 20 to 25%. The left ventricle has severely decreased function. The left ventricle demonstrates global hypokinesis. Left ventricular diastolic function could not be evaluated.  3. Right ventricular systolic function is mildly reduced. The right ventricular size is mildly enlarged. There is normal pulmonary artery systolic pressure.  4. Left atrial size was mild to moderately dilated.  5. Right atrial size was mildly dilated.  6. The mitral valve is degenerative. Mild to moderate mitral valve regurgitation. No evidence of mitral stenosis.  7. Tricuspid valve regurgitation is mild to moderate.  8. The aortic valve is tricuspid. Aortic valve regurgitation is mild. Mild to moderate aortic valve sclerosis/calcification is present, without any evidence of aortic stenosis.  9. The inferior vena cava is normal in size with <50% respiratory variability, suggesting right atrial pressure of 8 mmHg.    Neuro/Psych negative neurological ROS  negative psych ROS   GI/Hepatic negative GI ROS, Neg liver ROS,   Endo/Other  negative endocrine ROS  Renal/GU negative Renal ROS     Musculoskeletal negative musculoskeletal ROS (+)   Abdominal   Peds   Hematology negative hematology ROS (+) anemia ,   Anesthesia Other Findings   Reproductive/Obstetrics negative OB ROS                          Anesthesia Physical Anesthesia Plan  ASA: IV  Anesthesia Plan: General   Post-op Pain Management:    Induction: Intravenous  PONV Risk Score and Plan: 4 or greater and Ondansetron, Dexamethasone and Treatment may vary due to age or medical condition  Airway Management Planned: Oral ETT  Additional Equipment: None  Intra-op Plan:   Post-operative Plan: Possible Post-op intubation/ventilation  Informed Consent: I have reviewed the patients History and Physical, chart, labs and discussed the procedure including the risks, benefits and alternatives for the proposed anesthesia with the patient or authorized representative who has indicated his/her understanding and acceptance.     Consent reviewed with POA  Plan Discussed with: CRNA  Anesthesia Plan Comments: (Discussed plan of care with daughter, Clydie Braun. Questions answered. )       Anesthesia Quick Evaluation

## 2019-05-07 NOTE — Anesthesia Procedure Notes (Signed)
Procedure Name: Intubation Date/Time: 05/07/2019 3:16 PM Performed by: [place] Saarah Dewing C, CRNA Pre-anesthesia Checklist: Patient identified, Emergency Drugs available, Suction available and Patient being monitored Patient Re-evaluated:Patient Re-evaluated prior to induction Oxygen Delivery Method: Circle system utilized Preoxygenation: Pre-oxygenation with 100% oxygen Induction Type: IV induction Ventilation: Mask ventilation without difficulty Laryngoscope Size: Mac and 3 Grade View: Grade I Tube type: Oral Tube size: 7.0 mm Number of attempts: 1 Airway Equipment and Method: Stylet and Oral airway Placement Confirmation: ETT inserted through vocal cords under direct vision,  positive ETCO2 and breath sounds checked- equal and bilateral Secured at: 20 cm Tube secured with: Tape Dental Injury: Teeth and Oropharynx as per pre-operative assessment

## 2019-05-07 NOTE — Progress Notes (Addendum)
PROGRESS NOTE  Tiffany [name] JFH:[id number] DOB: 07-May-1923 DOA: 05/06/2019 PCP: System, Pcp Not In  HPI/Recap of past 24 hours: Tiffany [name] is a 86 y.o. female with medical history of anemia who fell at home on 3/16 and presented for pain in the left hip on 3/17.   Orthopedic surgery consulted with plan for left hip repair on 05/07/2019.  05/07/19: Seen and examined this morning in her room prior to her surgery.  Reports pain prior to receiving her pain medication.  POD#0 Planned left hip repair by Dr. Lucia Gaskins on 05/07/19.  Assessment/Plan: Active Problems:   S/p left hip fracture   New onset a-fib ([place])  Left hip fracture post fall, POD #0 planned repair on 3/18 Dr. Lucia Gaskins. Care managed by orthopedic surgery Defer DVT prophylaxis to orthopedic surgery Continue pain management PT OT to assess possibly tomorrow, defer instructions to orthopedic surgery. Fall precaution May need placement TOC consulted to assist with possible SNF placement/home health DME needs post surgery  Newly diagnosed acute systolic CHF 2D echo shows LVEF 20-25% Cardiology consulted and will see in the AM Post surgery will need strict I&O and daily weight Close monitoring post surgery  New onset A. Fib Possibly transient from acute illness TSH normal 2D echo done, results are pending CHADSVASc 3 Appears to be back in sinus rhythm Obtain twelve-lead EKG to confirm Cardiology consulted, will see in the AM  Physical debility/ambulatory function PT OT to assess possibly tomorrow if okay with orthopedic surgery   Code Status:  Full code   Family Communication: We will call family if okay with the patient.    Disposition Plan: Patient is from home.  Anticipate discharge to SNF or home with home health services once orthopedic surgery signs off.  Barrier to discharge POD #0 post left hip repair.  Assessment pending for PT OT.   Consultants:  Orthopedic surgeon  Procedures:  Planned Left hip repair  on 05/07/2019.  Antimicrobials:  Peri surgical  DVT prophylaxis: SCD; defer chemical DVT prophylaxis to orthopedic surgery.   Objective: Vitals:   05/07/19 0115 05/07/19 0505 05/07/19 1041 05/07/19 1227  BP: (!) 92/58 (!) 97/56 (!) 93/56 (!) 96/57  Pulse: 77 (!) 111 96 86  Resp: 18 18 18 18   Temp: 99.1 F (37.3 C) 99.1 F (37.3 C) 97.8 F (36.6 C) 98.8 F (37.1 C)  TempSrc: Oral Oral Oral Oral  SpO2: 94% 95% 92% 92%  Weight:      Height:        Intake/Output Summary (Last 24 hours) at 05/07/2019 1303 Last data filed at 05/07/2019 0600 Gross per 24 hour  Intake 0 ml  Output --  Net 0 ml   Filed Weights   05/06/19 1249  Weight: 61.2 kg    Exam:  . General: 86 y.o. year-old female well developed well nourished in no acute distress.  Alert and interactive. . Cardiovascular: Regular rate and rhythm with no rubs or gallops.  [name] Respiratory: Clear to auscultation with no wheezes or rales. Good inspiratory effort. . Abdomen: Soft nontender nondistended with normal bowel sounds x4 quadrants. . Musculoskeletal: Bilateral lower extremity edema. [name] Psychiatry: Mood is appropriate for condition and setting   Data Reviewed: CBC: Recent Labs  Lab 05/06/19 1411 05/07/19 0530  WBC 8.1 7.4  NEUTROABS 5.0  --   HGB 10.0* 9.0*  HCT 31.7* 27.7*  MCV 103.3* 102.6*  PLT 128* 937*   Basic Metabolic Panel: Recent Labs  Lab 05/06/19 1411 05/07/19 0530  NA 139 139  K 3.9 3.8  CL 105 104  CO2 27 28  GLUCOSE 111* 101*  BUN 32* 27*  CREATININE 0.68 0.69  CALCIUM 8.9 8.6*   GFR: Estimated Creatinine Clearance: 37.9 mL/min (by C-G formula based on SCr of 0.69 mg/dL). Liver Function Tests: No results for input(s): AST, ALT, ALKPHOS, BILITOT, PROT, ALBUMIN in the last 168 hours. No results for input(s): LIPASE, AMYLASE in the last 168 hours. No results for input(s): AMMONIA in the last 168 hours. Coagulation Profile: Recent Labs  Lab 05/06/19 1411  INR 1.2   Cardiac  Enzymes: No results for input(s): CKTOTAL, CKMB, CKMBINDEX, TROPONINI in the last 168 hours. BNP (last 3 results) No results for input(s): PROBNP in the last 8760 hours. HbA1C: No results for input(s): HGBA1C in the last 72 hours. CBG: No results for input(s): GLUCAP in the last 168 hours. Lipid Profile: No results for input(s): CHOL, HDL, LDLCALC, TRIG, CHOLHDL, LDLDIRECT in the last 72 hours. Thyroid Function Tests: Recent Labs    05/07/19 0530  TSH 1.361  FREET4 0.94   Anemia Panel: No results for input(s): VITAMINB12, FOLATE, FERRITIN, TIBC, IRON, RETICCTPCT in the last 72 hours. Urine analysis:    Component Value Date/Time   COLORURINE YELLOW 12/11/2016 2347   APPEARANCEUR CLEAR 12/11/2016 2347   LABSPEC 1.010 12/11/2016 2347   PHURINE 6.0 12/11/2016 2347   GLUCOSEU NEGATIVE 12/11/2016 2347   HGBUR SMALL (A) 12/11/2016 2347   BILIRUBINUR NEGATIVE 12/11/2016 2347   KETONESUR NEGATIVE 12/11/2016 2347   PROTEINUR NEGATIVE 12/11/2016 2347   NITRITE NEGATIVE 12/11/2016 2347   LEUKOCYTESUR NEGATIVE 12/11/2016 2347   Sepsis Labs: @LABRCNTIP (procalcitonin:4,lacticidven:4)  ) Recent Results (from the past 240 hour(s))  Respiratory Panel by RT PCR (Flu A&B, Covid) - Nasopharyngeal Swab     Status: None   Collection Time: 05/06/19  2:50 PM   Specimen: Nasopharyngeal Swab  Result Value Ref Range Status   SARS Coronavirus 2 by RT PCR NEGATIVE NEGATIVE Final    Comment: (NOTE) SARS-CoV-2 target nucleic acids are NOT DETECTED. The SARS-CoV-2 RNA is generally detectable in upper respiratoy specimens during the acute phase of infection. The lowest concentration of SARS-CoV-2 viral copies this assay can detect is 131 copies/mL. A negative result does not preclude SARS-Cov-2 infection and should not be used as the sole basis for treatment or other patient management decisions. A negative result may occur with  improper specimen collection/handling, submission of specimen  other than nasopharyngeal swab, presence of viral mutation(s) within the areas targeted by this assay, and inadequate number of viral copies (<131 copies/mL). A negative result must be combined with clinical observations, patient history, and epidemiological information. The expected result is Negative. Fact Sheet for Patients:  05/08/19 Fact Sheet for Healthcare Providers:  https://www.moore.com/ This test is not yet ap proved or cleared by the https://www.young.biz/ FDA and  has been authorized for detection and/or diagnosis of SARS-CoV-2 by FDA under an Emergency Use Authorization (EUA). This EUA will remain  in effect (meaning this test can be used) for the duration of the COVID-19 declaration under Section 564(b)(1) of the Act, 21 U.S.C. section 360bbb-3(b)(1), unless the authorization is terminated or revoked sooner.    Influenza A by PCR NEGATIVE NEGATIVE Final   Influenza B by PCR NEGATIVE NEGATIVE Final    Comment: (NOTE) The Xpert Xpress SARS-CoV-2/FLU/RSV assay is intended as an aid in  the diagnosis of influenza from Nasopharyngeal swab specimens and  should not be used as a sole  basis for treatment. Nasal washings and  aspirates are unacceptable for Xpert Xpress SARS-CoV-2/FLU/RSV  testing. Fact Sheet for Patients: https://www.moore.com/ Fact Sheet for Healthcare Providers: https://www.young.biz/ This test is not yet approved or cleared by the [place] FDA and  has been authorized for detection and/or diagnosis of SARS-CoV-2 by  FDA under an Emergency Use Authorization (EUA). This EUA will remain  in effect (meaning this test can be used) for the duration of the  Covid-19 declaration under Section 564(b)(1) of the Act, 21  U.S.C. section 360bbb-3(b)(1), unless the authorization is  terminated or revoked. Performed at [facility], [address], [place], [place] [address]   Surgical pcr screen     Status: Abnormal   Collection Time: 05/07/19  4:55 AM   Specimen: Nasal Mucosa; Nasal Swab  Result Value Ref Range Status   MRSA, PCR NEGATIVE NEGATIVE Final   Staphylococcus aureus POSITIVE (A) NEGATIVE Final    Comment: (NOTE) The Xpert SA Assay (FDA approved for NASAL specimens in patients 43 years of age and older), is one component of a comprehensive surveillance program. It is not intended to diagnose infection nor to guide or monitor treatment. Performed at [facility], [address], [place], [place] [address]       Studies: DG Chest Port 1 View  Result Date: 05/06/2019 CLINICAL DATA:  Preop for LEFT hip fracture EXAM: PORTABLE CHEST 1 VIEW COMPARISON:  Hip x-ray 05/06/2019 FINDINGS: Stable enlarged cardiac silhouette. Aorta is ectatic. No effusion, infiltrate pneumothorax. No acute osseous abnormality. IMPRESSION: No acute cardiopulmonary process. Electronically Signed   By: Genevive Bi M.D.   On: 05/06/2019 14:01   DG Hip Unilat W or Wo Pelvis 2-3 Views Left  Result Date: 05/06/2019 CLINICAL DATA:  Diffuse left hip pain.  Fall EXAM: DG HIP (WITH OR WITHOUT PELVIS) 2-3V LEFT COMPARISON:  None. FINDINGS: There is a left femoral intertrochanteric fracture with varus angulation and displacement. No subluxation or dislocation. IMPRESSION: Left femoral intertrochanteric fracture with displacement and varus angulation. Electronically Signed   By: Charlett Nose M.D.   On: 05/06/2019 13:28   DG FEMUR MIN 2 VIEWS LEFT  Result Date: 05/06/2019 CLINICAL DATA:  Preop for intramedullary nail placement. EXAM: LEFT FEMUR 2 VIEWS COMPARISON:  May 06, 2019 FINDINGS: There are advanced degenerative changes of the left knee. Evaluation of the left knee and distal left femur is limited by patient positioning. There is diffuse osteopenia. Again noted is a displaced comminuted fracture of the proximal left femur as  before. IMPRESSION: 1. Acute displaced fracture of the proximal left femur as before. 2. Suboptimal evaluation of the distal left femur and left knee secondary to patient positioning. There are degenerative changes of the left knee without definite evidence for displaced fracture. 3. Osteopenia. Electronically Signed   By: Katherine Mantle M.D.   On: 05/06/2019 14:55    Scheduled Meds: . Chlorhexidine Gluconate Cloth  6 each Topical Daily  . enoxaparin (LOVENOX) injection  40 mg Subcutaneous Q24H  . [START ON 05/08/2019] influenza vaccine adjuvanted  0.5 mL Intramuscular Tomorrow-1000  . mupirocin ointment  1 application Nasal BID    Continuous Infusions: .  ceFAZolin (ANCEF) IV       LOS: 1 day     Darlin Drop, MD [facility] Pager [phone]  If 7PM-7AM, please contact night-coverage [email or web address] Password [facility] 05/07/2019, 1:03 PM

## 2019-05-07 NOTE — Progress Notes (Signed)
eLink Physician-Brief Progress Note Patient Name: Tiffany [name] DOB: 04/07/1923 MRN: [id number]   Date of Service  05/07/2019  HPI/Events of Note  Patient is a 40F who is admitted to ICU s/p ORIF for left proximal femur fracture. She is suspected to have some baseline dementia per H&P.  The patient's records are notable for a severely reduced EF of 20-25% on echocardiogram from earlier today (although her HR at time of echo was 111bpm due to Afib which may artifactually reduce true LVEF).  Surgery was uncomplicated but the patient did require 2u pRBCs intra-op for an EBL of 700cc. No post-op labs obtained as of yet.  She is in no distress but she does appear anxious. It is difficult to obtain an SpO2 reading on her due to her taking it off. RN will try forehead or ear probe. She is currently on 2.5L [place].  Vitals: HR 108 (Afib) SpO2 ? As above RR 20 BP 91/53   eICU Interventions  # Neuro: - Reorientation and non-pharmacologic measures for anxiety and mgmt of possible dementia. - Pain control with morphine IV pushes PRN.  # Cardiac: - Hold off on aggressive rate control for now given rates are only ~110 and SBP is borderline low. - Eval for tolerance of IVF as below.  # Resp: - CXR to assess for any pulm edema in setting of OR / fluids / blood tx. - If low concern for pulm edema / fluid overload, gentle IVF may be beneficial. - Continue Stark for now and try to obtain better SpO2 reading.  # GI: - NPO for now. RN does not feel she is safe to take PO.  # Renal:  - Check BPM now.  # Heme: - Recheck CBC now given blood loss in OR and borderline low BP.  # MSK: - S/p ORIF. Lovenox in AM.  DVT PPX: Lovenox [place] as above.       Intervention Category Evaluation Type: New Patient Evaluation  [name] 05/07/2019, 9:45 PM

## 2019-05-07 NOTE — Progress Notes (Signed)
     Tiffany [name] is a 86 y.o. female   Orthopaedic diagnosis: Left intertrochanteric hip fracture  Subjective: Patient is resting comfortably.  Minimal pain. In preoperative for surgery.  Objectyive: Vitals:   05/07/19 1227 05/07/19 1427  BP: (!) 96/57 (!) 86/59  Pulse: 86 91  Resp: 18 18  Temp: 98.8 F (37.1 C) 99.3 F (37.4 C)  SpO2: 92% 92%     Exam: Respirations even and unlabored No acute distress  Left hip shortened.  Tender to palpation about the hip.  Assessment: Left intertrochanteric hip fracture   Plan: Plan for cephalomedullary nail of left intertrochanteric hip fracture.  I discussed the risks,  Benefits and alternatives to surgery with her family.  They wish to proceed.   Nicki Gu[place]ajara, MD

## 2019-05-08 ENCOUNTER — Inpatient Hospital Stay (HOSPITAL_COMMUNITY): Payer: Medicare Other

## 2019-05-08 DIAGNOSIS — I5041 Acute combined systolic (congestive) and diastolic (congestive) heart failure: Secondary | ICD-10-CM

## 2019-05-08 DIAGNOSIS — I4891 Unspecified atrial fibrillation: Secondary | ICD-10-CM

## 2019-05-08 DIAGNOSIS — I480 Paroxysmal atrial fibrillation: Secondary | ICD-10-CM

## 2019-05-08 DIAGNOSIS — S72142A Displaced intertrochanteric fracture of left femur, initial encounter for closed fracture: Principal | ICD-10-CM

## 2019-05-08 DIAGNOSIS — I959 Hypotension, unspecified: Secondary | ICD-10-CM

## 2019-05-08 DIAGNOSIS — R579 Shock, unspecified: Secondary | ICD-10-CM

## 2019-05-08 DIAGNOSIS — T8111XA Postprocedural  cardiogenic shock, initial encounter: Secondary | ICD-10-CM

## 2019-05-08 LAB — CBC
HCT: 31.3 % — ABNORMAL LOW (ref 36.0–46.0)
HCT: 29.1 % — ABNORMAL LOW (ref 36.0–46.0)
Hemoglobin: 9.8 g/dL — ABNORMAL LOW (ref 12.0–15.0)
Hemoglobin: 9.3 g/dL — ABNORMAL LOW (ref 12.0–15.0)
MCH: 31.9 pg (ref 26.0–34.0)
MCH: 32.1 pg (ref 26.0–34.0)
MCHC: 31.3 g/dL (ref 30.0–36.0)
MCHC: 32 g/dL (ref 30.0–36.0)
MCV: 102 fL — ABNORMAL HIGH (ref 80.0–100.0)
MCV: 100.3 fL — ABNORMAL HIGH (ref 80.0–100.0)
Platelets: 109 10*3/uL — ABNORMAL LOW (ref 150–400)
Platelets: 92 10*3/uL — ABNORMAL LOW (ref 150–400)
RBC: 3.07 MIL/uL — ABNORMAL LOW (ref 3.87–5.11)
RBC: 2.9 MIL/uL — ABNORMAL LOW (ref 3.87–5.11)
RDW: 14.5 % (ref 11.5–15.5)
RDW: 14.5 % (ref 11.5–15.5)
WBC: 14.9 10*3/uL — ABNORMAL HIGH (ref 4.0–10.5)
WBC: 11.4 10*3/uL — ABNORMAL HIGH (ref 4.0–10.5)
nRBC: 0 % (ref 0.0–0.2)
nRBC: 0 % (ref 0.0–0.2)

## 2019-05-08 LAB — HEMOGLOBIN AND HEMATOCRIT, BLOOD
HCT: 28.3 % — ABNORMAL LOW (ref 36.0–46.0)
Hemoglobin: 9 g/dL — ABNORMAL LOW (ref 12.0–15.0)

## 2019-05-08 LAB — PHOSPHORUS: Phosphorus: 3.3 mg/dL (ref 2.5–4.6)

## 2019-05-08 LAB — BASIC METABOLIC PANEL
Anion gap: 6 (ref 5–15)
BUN: 25 mg/dL — ABNORMAL HIGH (ref 8–23)
CO2: 30 mmol/L (ref 22–32)
Calcium: 8.5 mg/dL — ABNORMAL LOW (ref 8.9–10.3)
Chloride: 105 mmol/L (ref 98–111)
Creatinine, Ser: 0.79 mg/dL (ref 0.44–1.00)
GFR calc Af Amer: >60 mL/min (ref >60)
GFR calc non Af Amer: >60 mL/min (ref >60)
Glucose, Bld: 127 mg/dL — ABNORMAL HIGH (ref 70–99)
Potassium: 4.6 mmol/L (ref 3.5–5.1)
Sodium: 141 mmol/L (ref 135–145)

## 2019-05-08 LAB — MAGNESIUM: Magnesium: 1.5 mg/dL — ABNORMAL LOW (ref 1.7–2.4)

## 2019-05-08 MED ORDER — NOREPINEPHRINE 4 MG/250ML-% IV SOLN
INTRAVENOUS | Status: AC
  Filled 2019-05-08: qty 250

## 2019-05-08 MED ORDER — METOPROLOL TARTRATE 5 MG/5ML IV SOLN: 5.0000 mg | INTRAVENOUS | Status: DC | PRN

## 2019-05-08 MED ORDER — NOREPINEPHRINE 4 MG/250ML-% IV SOLN
2.0000 ug/min | INTRAVENOUS | Status: DC
  Administered 2019-05-08 (×3): 2 ug/min via INTRAVENOUS
  Administered 2019-05-10: 5 ug/min via INTRAVENOUS
  Administered 2019-05-11: 4 ug/min via INTRAVENOUS
  Filled 2019-05-08 (×5): qty 250

## 2019-05-08 MED ORDER — SODIUM CHLORIDE 0.9 % IV SOLN: 250.0000 mL | INTRAVENOUS | Status: DC

## 2019-05-08 MED ORDER — KETOROLAC TROMETHAMINE 15 MG/ML IJ SOLN
15.0000 mg | Freq: Four times a day (QID) | INTRAMUSCULAR | Status: DC | PRN
  Administered 2019-05-08 – 2019-05-09 (×2): 15 mg via INTRAVENOUS
  Filled 2019-05-08 (×2): qty 1

## 2019-05-08 MED ORDER — ORAL CARE MOUTH RINSE
15.0000 mL | Freq: Two times a day (BID) | OROMUCOSAL | Status: DC
  Administered 2019-05-08 – 2019-05-14 (×8): 15 mL via OROMUCOSAL

## 2019-05-08 MED ORDER — ACETAMINOPHEN 650 MG RE SUPP
650.0000 mg | Freq: Four times a day (QID) | RECTAL | Status: AC | PRN
  Administered 2019-05-08 – 2019-05-09 (×2): 650 mg via RECTAL
  Filled 2019-05-08 (×2): qty 1

## 2019-05-08 MED ORDER — AMIODARONE LOAD VIA INFUSION
150.0000 mg | Freq: Once | INTRAVENOUS | Status: AC
  Administered 2019-05-08: 11:00:00 150 mg via INTRAVENOUS
  Filled 2019-05-08: qty 83.34

## 2019-05-08 MED ORDER — FUROSEMIDE 10 MG/ML IJ SOLN
20.0000 mg | Freq: Four times a day (QID) | INTRAMUSCULAR | Status: AC
  Administered 2019-05-08 (×2): 20 mg via INTRAVENOUS
  Filled 2019-05-08 (×2): qty 2

## 2019-05-08 MED ORDER — METOPROLOL TARTRATE 5 MG/5ML IV SOLN
5.0000 mg | INTRAVENOUS | Status: DC | PRN
  Administered 2019-05-08: 5 mg via INTRAVENOUS
  Filled 2019-05-08 (×2): qty 5

## 2019-05-08 MED ORDER — MAGNESIUM SULFATE 2 GM/50ML IV SOLN
2.0000 g | Freq: Once | INTRAVENOUS | Status: AC
  Administered 2019-05-08: 2 g via INTRAVENOUS
  Filled 2019-05-08: qty 50

## 2019-05-08 MED ORDER — LACTATED RINGERS IV BOLUS
500.0000 mL | Freq: Once | INTRAVENOUS | Status: AC
  Administered 2019-05-08: 500 mL via INTRAVENOUS

## 2019-05-08 MED ORDER — LACTATED RINGERS IV BOLUS
500.0000 mL | Freq: Once | INTRAVENOUS | Status: AC
  Administered 2019-05-08: 08:00:00 500 mL via INTRAVENOUS

## 2019-05-08 MED ORDER — ALBUMIN HUMAN 25 % IV SOLN
25.0000 g | Freq: Four times a day (QID) | INTRAVENOUS | Status: AC
  Administered 2019-05-08 – 2019-05-09 (×4): 25 g via INTRAVENOUS
  Filled 2019-05-08 (×4): qty 100

## 2019-05-08 MED ORDER — PHENYLEPHRINE HCL-NACL 10-0.9 MG/250ML-% IV SOLN
25.0000 ug/min | INTRAVENOUS | Status: DC
  Administered 2019-05-08: 25 ug/min via INTRAVENOUS
  Filled 2019-05-08: qty 250

## 2019-05-08 MED ORDER — AMIODARONE HCL IN DEXTROSE 360-4.14 MG/200ML-% IV SOLN
60.0000 mg/h | INTRAVENOUS | Status: AC
  Administered 2019-05-08 (×2): 60 mg/h via INTRAVENOUS

## 2019-05-08 MED ORDER — AMIODARONE HCL IN DEXTROSE 360-4.14 MG/200ML-% IV SOLN
30.0000 mg/h | INTRAVENOUS | Status: DC
  Administered 2019-05-08 – 2019-05-11 (×7): 30 mg/h via INTRAVENOUS
  Filled 2019-05-08 (×8): qty 200

## 2019-05-08 MED ORDER — KETOROLAC TROMETHAMINE 15 MG/ML IJ SOLN: 15.0000 mg | Freq: Four times a day (QID) | INTRAMUSCULAR | Status: DC

## 2019-05-08 MED ORDER — LIP MEDEX EX OINT
TOPICAL_OINTMENT | CUTANEOUS | Status: AC
  Filled 2019-05-08: qty 7

## 2019-05-08 NOTE — Consult Note (Addendum)
Cardiology Consultation:   Patient ID: Tiffany [name]; [id number]; Jun 16, 1923   Admit date: 05/06/2019 Date of Consult: 05/08/2019  Primary Care Provider: System, Pcp Not In Primary Cardiologist: New to [facility] Primary Electrophysiologist:  None   Patient Profile:   Tiffany [name] is a 86 y.o. female with a PMH of anemia and dementia, who presented with hip pain after a fall at home resulting in a hip fracture who is being seen today for the evaluation of acute CHF and new onset Afib at the request of Dr. Margo Aye.  History of Present Illness:   Ms. Vollrath was in her usual state of health until the 05/05/19 when she suffered an unwitnessed fall at home. She was unable to ambulate afterwards and pain was noted to increase to the point that she couldn't get out of her chair so EMS was activated. She was found to have a left hip fracture on arrival. She was also noted to be in atrial fibrillation, though has no prior history of this. She was taken to the OR for surgical repair of her hip fracture 05/07/19. Her surgery was reported to be uneventful. She was noted to have received 2 [facility] for acute blood loss during surgery. She had an echocardiogram to further evaluate her new onset Afib and was found to have an EF 20-25% (though suspected this was not a true reflection given Afib with RVR at the time of her echo), global hypokinesis, mildly reduced systolic function, mild biatrial enlargement, mild-moderate MR, and mild AI. Overnight she was noted to have HR's in the 150s-170s and received IV metoprolol (7.5mg  total) for management which resulted in hypotension requiring pressors to maintain MAP >65. She received IVF's overnight and is net +2L this admission. Cardiology was asked to evaluate for new onset CHF and atrial fibrillation.  At the time of my evaluation, the patient is in bed with mittens on. Confused. Oriented to self only. She was oriented to place and current condition. She asks "how many  years ago did I fall?". She has no complaints at this time. Denies chest pain, SOB, or palpitations. No family at bedside.  Spoke to daughter, Clydie Braun, on the phone whom the patient lives with. She reports her mother has been relative healthy though does not follow with a PCP, relying on urgent care visits and annual nurse visits through her insurance for medical care. She does not carry a formal diagnosis of dementia but reports her mother has had increased confusion over the past several years. Ordinarily is A&O x2 (person/place). She reports the patient has low BP at baseline. She does have frequent falls, she estimates 10+ mechanical falls in the past 3 months. She states the patient is not a complainer and has not noted any chest pain, SOB, DOE, palpitations, dizziness, lightheadedness, or syncope. She denied LE edema, orthopnea, or PND.   Past Medical History:  Diagnosis Date  . Anemia     Past Surgical History:  Procedure Laterality Date  . ABDOMINAL HYSTERECTOMY       Home Medications:  Prior to Admission medications   Medication Sig Start Date End Date Taking? Authorizing Provider  diazepam (VALIUM) 5 MG tablet Take 5 mg by mouth once.   Yes [provider]  HYDROcodone-acetaminophen (NORCO/VICODIN) 5-325 MG tablet Take 1 tablet by mouth once.   Yes [provider]    Inpatient Medications: Scheduled Meds: . Chlorhexidine Gluconate Cloth  6 each Topical Daily  . docusate sodium  100 mg  Oral BID  . enoxaparin (LOVENOX) injection  40 mg Subcutaneous Q24H  . influenza vaccine adjuvanted  0.5 mL Intramuscular Tomorrow-1000  . mouth rinse  15 mL Mouth Rinse BID  . mupirocin ointment  1 application Nasal BID   Continuous Infusions: . sodium chloride 10 mL/hr at 05/08/19 0700  . sodium chloride    .  ceFAZolin (ANCEF) IV Stopped (05/08/19 0606)  . lactated ringers    . phenylephrine (NEO-SYNEPHRINE) Adult infusion 25 mcg/min (05/08/19 0700)   PRN Meds: sodium  chloride, acetaminophen, HYDROcodone-acetaminophen, metoCLOPramide **OR** metoCLOPramide (REGLAN) injection, metoprolol tartrate, morphine injection, morphine injection, ondansetron **OR** ondansetron (ZOFRAN) IV  Allergies:    Allergies  Allergen Reactions  . Strawberry Flavor Hives and Rash    Strawberries    Social History:   Social History   Socioeconomic History  . Marital status: Single    Spouse name: Not on file  . Number of children: Not on file  . Years of education: Not on file  . Highest education level: Not on file  Occupational History  . Not on file  Tobacco Use  . Smoking status: Never Smoker  . Smokeless tobacco: Never Used  Substance and Sexual Activity  . Alcohol use: No  . Drug use: No  . Sexual activity: Not on file  Other Topics Concern  . Not on file  Social History Narrative  . Not on file   Social Determinants of Health   Financial Resource Strain:   . Difficulty of Paying Living Expenses:   Food Insecurity:   . Worried About Programme researcher, broadcasting/film/video in the Last Year:   . Barista in the Last Year:   Transportation Needs:   . Freight forwarder (Medical):   [name] Lack of Transportation (Non-Medical):   Physical Activity:   . Days of Exercise per Week:   . Minutes of Exercise per Session:   Stress:   . Feeling of Stress :   Social Connections:   . Frequency of Communication with Friends and Family:   . Frequency of Social Gatherings with Friends and Family:   . Attends Religious Services:   . Active Member of Clubs or Organizations:   . Attends Banker Meetings:   [name] Marital Status:   Intimate Partner Violence:   . Fear of Current or Ex-Partner:   . Emotionally Abused:   [name] Physically Abused:   . Sexually Abused:     Family History:    Family History  Problem Relation Age of Onset  . Hypertension Other      ROS:  Please see the history of present illness.  ROS  All other ROS reviewed and negative.      Physical Exam/Data:   Vitals:   05/08/19 0610 05/08/19 0630 05/08/19 0645 05/08/19 0700  BP: (!) 88/42 (!) 71/36 (!) 60/20 (!) 75/25  Pulse: 96 89 94 90  Resp:      Temp:      TempSrc:      SpO2: 97% (!) 87% 97% 97%  Weight:      Height:        Intake/Output Summary (Last 24 hours) at 05/08/2019 0750 Last data filed at 05/08/2019 0700 Gross per 24 hour  Intake 2878.87 ml  Output 900 ml  Net 1978.87 ml   Filed Weights   05/07/19 1420 05/07/19 1427 05/08/19 0444  Weight: 61.2 kg 61.2 kg 58.1 kg   Body mass index is 21.99 kg/m.  General:  Thin  elderly female laying in bed in NAD HEENT: sclera anicteric  Neck: no JVD Vascular: No carotid bruits; distal pulses 2+ bilaterally Cardiac:  normal S1, S2; IRIR; no murmurs, rubs, or gallops appreciated Lungs:  clear to auscultation bilaterally, no wheezing, rhonchi or rales  Abd: NABS, soft, nontender, no hepatomegaly Ext: no edema Musculoskeletal:  No deformities, BUE and BLE strength normal and equal Skin: warm and dry  Neuro:  CNs 2-12 intact, no focal abnormalities noted Psych:  Normal affect   EKG:  The EKG was personally reviewed and demonstrates:  Atrial fibrillation with rate 84 bpm, possible old septal infarct, no STE/D, no TWI; no comparison available Telemetry:  Telemetry was personally reviewed and demonstrates:  Atrial fibrillation with variable rates - 80s-170s; occasional PVCs.  Relevant CV Studies: Echocardiogram 05/07/19: 1. Difficult to assess EF due to atrial fibrillation with RVR, but  appears severely reduced 20-25%. Would recommend to control rates and  re-check EF.  2. Left ventricular ejection fraction, by estimation, is 20 to 25%. The  left ventricle has severely decreased function. The left ventricle  demonstrates global hypokinesis. Left ventricular diastolic function could  not be evaluated.  3. Right ventricular systolic function is mildly reduced. The right  ventricular size is mildly  enlarged. There is normal pulmonary artery  systolic pressure.  4. Left atrial size was mild to moderately dilated.  5. Right atrial size was mildly dilated.  6. The mitral valve is degenerative. Mild to moderate mitral valve  regurgitation. No evidence of mitral stenosis.  7. Tricuspid valve regurgitation is mild to moderate.  8. The aortic valve is tricuspid. Aortic valve regurgitation is mild.  Mild to moderate aortic valve sclerosis/calcification is present, without  any evidence of aortic stenosis.  9. The inferior vena cava is normal in size with <50% respiratory  variability, suggesting right atrial pressure of 8 mmHg.   Laboratory Data:  Chemistry Recent Labs  Lab 05/07/19 0530 05/07/19 2226 05/08/19 0140  NA 139 141 141  K 3.8 4.4 4.6  CL 104 104 105  CO2 28 25 30   GLUCOSE 101* 169* 127*  BUN 27* 24* 25*  CREATININE 0.69 0.77 0.79  CALCIUM 8.6* 8.4* 8.5*  GFRNONAA >60 >60 >60  GFRAA >60 >60 >60  ANIONGAP 7 12 6     No results for input(s): PROT, ALBUMIN, AST, ALT, ALKPHOS, BILITOT in the last 168 hours. Hematology Recent Labs  Lab 05/07/19 0530 05/07/19 2226 05/08/19 0140  WBC 7.4 14.9* 11.4*  RBC 2.70* 3.07* 2.90*  HGB 9.0* 9.8* 9.3*  HCT 27.7* 31.3* 29.1*  MCV 102.6* 102.0* 100.3*  MCH 33.3 31.9 32.1  MCHC 32.5 31.3 32.0  RDW 13.2 14.5 14.5  PLT 124* 109* 92*   Cardiac EnzymesNo results for input(s): TROPONINI in the last 168 hours. No results for input(s): TROPIPOC in the last 168 hours.  BNPNo results for input(s): BNP, PROBNP in the last 168 hours.  DDimer No results for input(s): DDIMER in the last 168 hours.  Radiology/Studies:  DG CHEST PORT 1 VIEW  Result Date: 05/07/2019 CLINICAL DATA:  Pulmonary edema and altered mental status EXAM: PORTABLE CHEST 1 VIEW COMPARISON:  05/06/2019 FINDINGS: Patient is extremely rotated to the right. The left lung is clear. The visible portion of the right lung is unremarkable. Mild cardiomegaly.  IMPRESSION: Cardiomegaly without focal airspace disease. The patient is extremely rotated to the right. Electronically Signed   By: 05/09/2019 M.D.   On: 05/07/2019 22:28   DG Chest [facility]  1 View  Result Date: 05/06/2019 CLINICAL DATA:  Preop for LEFT hip fracture EXAM: PORTABLE CHEST 1 VIEW COMPARISON:  Hip x-ray 05/06/2019 FINDINGS: Stable enlarged cardiac silhouette. Aorta is ectatic. No effusion, infiltrate pneumothorax. No acute osseous abnormality. IMPRESSION: No acute cardiopulmonary process. Electronically Signed   By: Suzy Bouchard M.D.   On: 05/06/2019 14:01   DG C-Arm 1-60 Min-No Report  Result Date: 05/07/2019 Fluoroscopy was utilized by the requesting physician.  No radiographic interpretation.   ECHOCARDIOGRAM COMPLETE  Result Date: 05/07/2019    ECHOCARDIOGRAM REPORT   Patient Name:   ARIANIE COUSE Date of Exam: 05/07/2019 Medical Rec #:  [id number]     Height:       64.0 in Accession #:    [id number]    Weight:       135.0 lb Date of Birth:  Jul 03, 1923     BSA:          1.655 m Patient Age:    73 years      BP:           97/56 mmHg Patient Gender: F             HR:           111 bpm. Exam Location:  Inpatient Procedure: 2D Echo                            MODIFIED REPORT: This report was modified by Eleonore Chiquito MD on 05/07/2019 due to error.  Indications:     Atrial Fibrillation 427.31 / I48.91  History:         Patient has no prior history of Echocardiogram examinations.                  Anemia. Left hip fracture. Dementia.  Sonographer:     Darlina Sicilian RDCS Referring Phys:  [id number] Debbe Odea Diagnosing Phys: Eleonore Chiquito MD  Sonographer Comments: Technically challenging study due to limited acoustic windows. IMPRESSIONS  1. Difficult to assess EF due to atrial fibrillation with RVR, but appears severely reduced 20-25%. Would recommend to control rates and re-check EF.  2. Left ventricular ejection fraction, by estimation, is 20 to 25%. The left ventricle has severely decreased  function. The left ventricle demonstrates global hypokinesis. Left ventricular diastolic function could not be evaluated.  3. Right ventricular systolic function is mildly reduced. The right ventricular size is mildly enlarged. There is normal pulmonary artery systolic pressure.  4. Left atrial size was mild to moderately dilated.  5. Right atrial size was mildly dilated.  6. The mitral valve is degenerative. Mild to moderate mitral valve regurgitation. No evidence of mitral stenosis.  7. Tricuspid valve regurgitation is mild to moderate.  8. The aortic valve is tricuspid. Aortic valve regurgitation is mild. Mild to moderate aortic valve sclerosis/calcification is present, without any evidence of aortic stenosis.  9. The inferior vena cava is normal in size with <50% respiratory variability, suggesting right atrial pressure of 8 mmHg. FINDINGS  Left Ventricle: Left ventricular ejection fraction, by estimation, is 20 to 25%. The left ventricle has severely decreased function. The left ventricle demonstrates global hypokinesis. The left ventricular internal cavity size was normal in size. There is no left ventricular hypertrophy. Left ventricular diastolic function could not be evaluated. Right Ventricle: The right ventricular size is mildly enlarged. No increase in right ventricular wall thickness. Right ventricular systolic function is mildly reduced. There is normal  pulmonary artery systolic pressure. The tricuspid regurgitant velocity  is 2.43 m/s, and with an assumed right atrial pressure of 8 mmHg, the estimated right ventricular systolic pressure is 31.6 mmHg. Left Atrium: Left atrial size was mild to moderately dilated. Right Atrium: Right atrial size was mildly dilated. Pericardium: There is no evidence of pericardial effusion. Mitral Valve: The mitral valve is degenerative in appearance. Mild to moderate mitral annular calcification. Mild to moderate mitral valve regurgitation. No evidence of mitral valve  stenosis. Tricuspid Valve: The tricuspid valve is grossly normal. Tricuspid valve regurgitation is mild to moderate. No evidence of tricuspid stenosis. Aortic Valve: The aortic valve is tricuspid. . There is mild thickening and mild calcification of the aortic valve. Aortic valve regurgitation is mild. Aortic regurgitation PHT measures 370 msec. Mild to moderate aortic valve sclerosis/calcification is present, without any evidence of aortic stenosis. There is mild thickening of the aortic valve. There is mild calcification of the aortic valve. Pulmonic Valve: The pulmonic valve was not assessed. Pulmonic valve regurgitation is not visualized. No evidence of pulmonic stenosis. Aorta: The aortic root is normal in size and structure. Venous: The inferior vena cava is normal in size with less than 50% respiratory variability, suggesting right atrial pressure of 8 mmHg. IAS/Shunts: No atrial level shunt detected by color flow Doppler. EKG: Rhythm strip during this exam demostrated atrial fibrillation.  LEFT VENTRICLE PLAX 2D LVOT diam:     1.90 cm  Diastology LV SV:         28       LV e' lateral:   15.90 cm/s LV SV Index:   17       LV E/e' lateral: 3.5 LVOT Area:     2.84 cm LV e' medial:    7.88 cm/s                         LV E/e' medial:  7.0  RIGHT VENTRICLE RV S prime:     10.30 cm/s TAPSE (M-mode): 1.6 cm LEFT ATRIUM             Index       RIGHT ATRIUM           Index LA Vol (A2C):   66.2 ml 39.99 ml/m RA Area:     23.00 cm LA Vol (A4C):   62.3 ml 37.63 ml/m RA Volume:   70.10 ml  42.35 ml/m LA Biplane Vol: 65.0 ml 39.27 ml/m  AORTIC VALVE LVOT Vmax:   63.30 cm/s LVOT Vmean:  46.400 cm/s LVOT VTI:    0.100 m AI PHT:      370 msec MITRAL VALVE                 TRICUSPID VALVE MV Area (PHT): 4.19 cm      TR Peak grad:   23.6 mmHg MV Decel Time: 181 msec      TR Vmax:        243.00 cm/s MR Peak grad:    58.1 mmHg MR Mean grad:    43.0 mmHg   SHUNTS MR Vmax:         381.00 cm/s Systemic VTI:  0.10 m MR Vmean:         316.0 cm/s  Systemic Diam: 1.90 cm MR PISA:         0.25 cm MR PISA Eff ROA: 3 mm MR PISA Radius:  0.20 cm MV E velocity: 55.25 cm/s Lennie Odor MD  Electronically signed by Lennie OdorWesley O'Neal MD Signature Date/Time: 05/07/2019/2:06:43 PM    Final (Updated)    DG Hip Unilat W or Wo Pelvis 2-3 Views Left  Result Date: 05/06/2019 CLINICAL DATA:  Diffuse left hip pain.  Fall EXAM: DG HIP (WITH OR WITHOUT PELVIS) 2-3V LEFT COMPARISON:  None. FINDINGS: There is a left femoral intertrochanteric fracture with varus angulation and displacement. No subluxation or dislocation. IMPRESSION: Left femoral intertrochanteric fracture with displacement and varus angulation. Electronically Signed   By: Charlett NoseKevin  Dover M.D.   On: 05/06/2019 13:28   DG FEMUR MIN 2 VIEWS LEFT  Result Date: 05/07/2019 CLINICAL DATA:  86 year old female status post left femoral ORIF. EXAM: LEFT FEMUR 2 VIEWS COMPARISON:  Radiograph dated 05/06/2019. FINDINGS: Six fluoroscopic intraoperative images provided. The total fluoroscopic time is 26 seconds. Left femoral intramedullary rod and transcervical screw noted. IMPRESSION: Status post left femoral ORIF. Electronically Signed   By: Elgie CollardArash  Radparvar M.D.   On: 05/07/2019 20:44   DG FEMUR MIN 2 VIEWS LEFT  Result Date: 05/06/2019 CLINICAL DATA:  Preop for intramedullary nail placement. EXAM: LEFT FEMUR 2 VIEWS COMPARISON:  May 06, 2019 FINDINGS: There are advanced degenerative changes of the left knee. Evaluation of the left knee and distal left femur is limited by patient positioning. There is diffuse osteopenia. Again noted is a displaced comminuted fracture of the proximal left femur as before. IMPRESSION: 1. Acute displaced fracture of the proximal left femur as before. 2. Suboptimal evaluation of the distal left femur and left knee secondary to patient positioning. There are degenerative changes of the left knee without definite evidence for displaced fracture. 3. Osteopenia.  Electronically Signed   By: Katherine Mantlehristopher  Green M.D.   On: 05/06/2019 14:55    Assessment and Plan:   1. Acute combined CHF: patient presented s/p fall and was found to be in atrial fibrillation. Echo showed EF 20-25% with global hypokinesis, though she was noted to be in Afib with RVR at the time and recommended for repeat echo once rates improved. She has no prior history of CHF and has not had an ischemic evaluation. CXR does not suggest pulmonary edema. I&Os reviewed and she is net +2L this admission. Weights have decreased from 135lbs on admission to 128lbs today. She is currently requiring pressors to maintain MAT >65 which is limiting GDT at this time.  - Continue to monitor volume status closely  - Anticipate starting BBlocker prior to discharge - Continue pressors per PCCM - transitioned from neo to levophed this morning.   2. New onset paroxysmal atrial fibrillation: initial EKG with rate controlled atrial fibrillation. Unclear acuity. Patient is confused so unable to assess symptomology. She has had intermittent RVR this admission following surgical repair of her hip fracture. Hgb remains stable following surgery and transfusion of 2 uPRBC's. Daughter reports 10+ mechanical falls in the past 3 months. - This patients CHA2DS2-VASc Score and unadjusted Ischemic Stroke Rate (% per year) is equal to at least 4.8 % stroke rate/year from a score of 4 Above score calculated as 1 point each if present [CHF, HTN, DM, Vascular=MI/PAD/Aortic Plaque, Age if 65-74, or Female] Above score calculated as 2 points each if present [Age > 75, or Stroke/TIA/TE] - Given fall risk, suspect risks outweigh benefits.  - Favor amiodarone for rate control given hypotension  3. Hypotension: She received 7.5mg  total of IV metoprolol overnight for management of Afib with RVR and subsequently developed hypotension. Started on pressors to maintain MAP >65. BP remains soft.  -  Continue pressors per PCCM - goal MAP  >65  4. Dementia: she is A&O x2 at baseline. Suspect hospital delirium is playing a roll in current AMS. She is s/p fall but no head strike reported.  - Continue to monitor for now - Continue delirium precautions  5. Code status: spoke to the daughter, Clydie Braun who is her POA, about patient's wishes. She states her mother would not want advanced measures taken to prolong her life - no chest compressions or breathing tube. She states her mother has a living will.  - Will place DNR order given above discussion.    For questions or updates, please contact [facility] Please consult [email or web address] for contact info under Cardiology/STEMI.   Signed, Beatriz Stallion, PA-C  05/08/2019 7:50 AM [phone]  As above, pt seen and examined.  Briefly she is a 86 year old female with past medical history of dementia admitted with hip fracture after fall for evaluation of atrial fibrillation.  Patient appears to have significant dementia and cannot provide adequate history.  She has fallen multiple times recently.  She fell and fractured her hip and had surgical repair on March 18.  At time of admission she was noted to be in atrial fibrillation.  Echocardiogram showed ejection fraction 20 to 25%, mild biatrial enlargement, mild to moderate mitral and tricuspid regurgitation and mild aortic insufficiency.  ECG atrial fibrillation. Cardiology now asked to evaluate.  1 newly diagnosed atrial fibrillation-patient's heart rate is elevated.  Her blood pressure is low.  We therefore cannot add Cardizem or beta blockade.  I will begin with IV amiodarone.  Adjust regimen as needed. CHADSvasc 3. She has had multiple falls in the past 3 months and now has fractured her hip.  She has significant dementia.  She is therefore not a candidate for anticoagulation.  2 cardiomyopathy-etiology unclear.  However given dementia and age she is not a candidate for aggressive cardiac evaluation.  Her blood pressure will not allow  beta-blockade, ARB or Entresto at this point.  3 significant dementia  4 status post hip fracture-Per orthopedics.  5 no CODE BLUE status  Olga Millers, MD

## 2019-05-08 NOTE — Progress Notes (Signed)
eLink Physician-Brief Progress Note Patient Name: [name] DOB: 03-18-1923 MRN: [id number]   Date of Service  05/08/2019  HPI/Events of Note  Notified by RN of low BP. May be secondary to the metoprolol needed to bring her Afib with RVR under control.   eICU Interventions  Will start low dose peripheral Neo to maintain MAP >\= 65 mmHg (levo would likely to trigger recurrent Afib w/ RVR). She has already received 1L IVF and 2u PRBCs in the OR for volume in the last few hours. Hgb rechecked twice since she reeturned from OR and has been stable.     Intervention Category Major Interventions: Hypotension - evaluation and management  Tiffany [name] 05/08/2019, 6:32 AM

## 2019-05-08 NOTE — Op Note (Signed)
Tiffany [name] female 86 y.o. 05/07/2019   PreOperative Diagnosis: Left displaced comminuted intertrochanteric hip fracture  PostOperative Diagnosis: Same  PROCEDURE: Open reduction internal fixation with intramedullary nail and cerclage wire of left intertrochanteric hip fracture  SURGEON: Dub Mikes, MD  ASSISTANT surgeon: Dr. Gean Birchwood, MD  ANESTHESIA: General  FINDINGS: Displaced and comminuted left intertrochanteric hip fracture with complete disruption of the posterior wall and lesser trochanter as well as the greater trochanter.  IMPLANTS: [employer] 13 x 440 mm  INDICATIONS:86 y.o. female had a fall and sustained a left hip fracture.  She was seen in the emergency department where diagnosis was made.  Orthopedics was consulted.  Per patient's family she is ambulatory at baseline using a walker and therefore given this they requested operative fixation of her hip.  She has dementia.  She lives in a facility.  Discussed with the family the risks, benefits and alternatives of surgery which include but not limited to wound healing complications, infection, nonunion, malunion, need for further surgery, damage to surrounding anatomic structures as well as the perioperative anesthetic risk which include death.  We discussed the 1 year mortality rate being near 30% in this patient population with this diagnosis.  After weighing these risks they wish to proceed with surgery.  PROCEDURE: Patient was identified in the preoperative holding area.  The left leg was marked myself.  Consent was signed by myself and the patient.  Preoperative antibiotics were given.  She was taken to the operative suite and after general anesthesia was induced difficulty she was placed on the Hana table.  The left leg was placed in a foot holder for traction device in the right leg was placed in the well-leg holder with the hip in flexion.  Prior to prepping and draping x-rays were performed.  After  manual traction along the left lower extremity we noticed that the hip fracture was not reducing well.  We decided to prep and drape and open the fracture for fixation.  The left hip was prepped and draped in usual sterile fashion using a shower curtain drape.  Timeout was performed.  A longitudinal incision was made at the level of the fracture site on the lateral aspect of the hip and femur.  This taken sharply down through skin and subcutaneous tissue.  Then the IT band was identified and incision was made in line with the incision through the IT band.  Then blunt dissection was used to gain access to the lateral femur and the fracture site.  There was significant amount of displacement of the fracture site.  The distal diaphyseal portion was superimposed over the proximal portion of the neck.  We were unable to disimpact this well.  It was noted that the large posterior wall fragment had rotated and was flipped up out of position.  Given the complexity of the fracture and intraoperative consult was performed for Dr. Gean Birchwood to assist with the surgery.  Once he was finished with his case he was able to come over and help assist with the surgery.  After more attempts at reduction on the Hana table were performed and unsuccessful we decided to pack and dress the wound and change tables and positions to a lateral position to allow for formal open reduction of her hip fracture and fixation.  The wounds were packed with laparotomy sponges and dressed with ABD pads and Medipore tape.  Then drapes were removed.  She was placed on a flat top table and  placed in the lateral decubitus position with hip positioners.  Then the left lower extremity was once again prepped and draped in the usual sterile fashion.  We then extended the incision proximally and distally to gain direct visualization of the fracture site.  There was a large posterior wall fragment that was displaced.  This was dissected out and replaced to the  shaft portion of the fracture.  Then a cerclage wire was placed around this to hold it in place.  Then the proximal portion of the fracture was reduced under direct visualization and held with cerclage wire.  The greater trochanter portion of the fracture was severely comminuted and displaced posteriorly.  Then the intraoperative fluoroscopy was used to confirm adequate reduction of the hip fracture in AP and lateral planes.  Once adequate and acceptable reduction was confirmed the femoral canal was reamed up to a 14.5 mm reamer with minimal chatter.  Then the length was confirmed.  The nail was chosen.  The intramedullary nail was placed into the femoral canal without difficulty.  Then using fluoroscopic help and the positioning guide 2 wires were placed up into the femoral neck into the femoral head in appropriate position.  This was for the derotational screw and for the cephalomedullary component of the nail.  Then the drill and reamer was used.  Then the cephalomedullary screw was placed as was the derotational screw.  These where placed with good purchase.  We then confirmed appropriate position on fluoroscopy.  Then we placed 2 distal interlocking screws using perfect circle technique.  After this the wound was copiously irrigated with normal saline.  The wound was closed in a layered fashion using 2-0 PDS, 2-0 Monocryl and staples.  An Aquacel dressing was placed.  During the surgery the patient was given 2 units of packed red blood cells.  She does have a history of A. fib but was hemodynamically stable at the end of the case.  Per anesthesia recommendation she was transferred to the ICU.  All counts were correct at the end of the case.   POST OPERATIVE INSTRUCTIONS: Touchdown weightbearing to left lower extremity Work with physical therapy for disposition Okay to restart chemoprophylaxis after 24 hours She will finish postoperative antibiotics She will follow-up with me in 2 weeks for left femur  x-rays and suture removal if appropriate  BLOOD LOSS:  700 cc         DRAINS: none         SPECIMEN: none       COMPLICATIONS: Patient required repositioning due to amount of fracture comminution and inability to gain acceptable reduction in the supine position.         Disposition: ICU - extubated and stable.         Condition: stable

## 2019-05-08 NOTE — Evaluation (Signed)
Clinical/Bedside Swallow Evaluation Patient Details  Name: Tiffany [name] MRN: [id number] Date of Birth: 08-06-1923  Today's Date: 05/08/2019 Time: SLP Start Time (ACUTE ONLY): 1159 SLP Stop Time (ACUTE ONLY): 1239 SLP Time Calculation (min) (ACUTE ONLY): 40 min  Past Medical History:  Past Medical History:  Diagnosis Date  . Anemia    Past Surgical History:  Past Surgical History:  Procedure Laterality Date  . ABDOMINAL HYSTERECTOMY     HPI:  Tiffany [name] is a 86 y.o. female with medical history of anemia who fell at home on 3/16 and presented for pain in the left hip on 3/17.   Orthopedic surgery consulted with plan for left hip repair on 05/07/2019.  05/08/2018 CXR Small RIGHT effusion.  Otherwise no acute cardiopulmonary findings.   Assessment / Plan / Recommendation Clinical Impression  Pt presents with functional oropahryngeal swallow ability especially based on her advanced age.  No focal cn deficts apparent.  SLP fed pt due to pt's hx of agitation/confusion with mitts in place.  Pt provided with water, juice, applesauce and cracker.  Slow but effective mastication without oral resdiuals.  Recommend dys3/thin diet. No SLP follow up indicated. SLP Visit Diagnosis: Dysphagia, unspecified (R13.10)    Aspiration Risk  Mild aspiration risk    Diet Recommendation Dysphagia 3 (Mech soft);Thin liquid   Liquid Administration via: Straw;Cup Medication Administration: Whole meds with puree Supervision: Patient able to self feed Compensations: Slow rate;Small sips/bites;Follow solids with liquid Postural Changes: Seated upright at 90 degrees;Remain upright for at least 30 minutes after po intake    Other  Recommendations Oral Care Recommendations: Oral care QID   Follow up Recommendations None      Frequency and Duration   n/a      n/a   Prognosis        Swallow Study   General Date of Onset: 05/08/19 HPI: Tiffany [name] is a 86 y.o. female with medical history of anemia  who fell at home on 3/16 and presented for pain in the left hip on 3/17.   Orthopedic surgery consulted with plan for left hip repair on 05/07/2019.  05/08/2018 CXR Small RIGHT effusion.  Otherwise no acute cardiopulmonary findings. Type of Study: Bedside Swallow Evaluation Previous Swallow Assessment: none in the system Diet Prior to this Study: NPO Temperature Spikes Noted: No Respiratory Status: Room air History of Recent Intubation: No Behavior/Cognition: Alert;Distractible Oral Cavity Assessment: Within Functional Limits Oral Cavity - Dentition: Dentures, top;Dentures, bottom Patient Positioning: Upright in bed Baseline Vocal Quality: Normal Volitional Cough: Cognitively unable to elicit Volitional Swallow: Unable to elicit    Oral/Motor/Sensory Function Overall Oral Motor/Sensory Function: Within functional limits   Ice Chips Ice chips: Not tested   Thin Liquid Thin Liquid: Within functional limits Presentation: Cup;Straw Other Comments: pt did not pass 3 ounce yale water test    Nectar Thick Nectar Thick Liquid: Not tested   Honey Thick Honey Thick Liquid: Not tested   Puree Puree: Within functional limits Presentation: Spoon   Solid     Solid: Within functional limits Presentation: Self Fed Other Comments: minimally prolonged mastication but overall functional given loose dentures      Chales Abrahams 05/08/2019,12:57 PM Rolena Infante, MS [facility] SLP Acute Rehab Services Office [phone]

## 2019-05-08 NOTE — TOC Initial Note (Addendum)
Transition of Care [facility]) - Initial/Assessment Note    Patient Details  Name: Tiffany [name] MRN: [id number] Date of Birth: 29-Apr-1923  Transition of [facility]) CM/SW Contact:    Clearance Coots, LCSW Phone Number: 05/08/2019, 1:00 PM  Clinical Narrative:   Patient admitted after a mechanical fall which resulted in left hip fracture. Surgical intervention.            CSW reached out to the patient daughter Tiffany [name], introduced role in patient care. She is hopeful the patient can to transition to a rehab facility . She reports,  "it will be difficult for her to rehab at home, I work all day and my son is in school." She reports the patient was  "scooting around" prior to her fall. She used a Museum/gallery exhibitions officer. She uses a chair that goes "up and down" in the shower to bathe. TOC staff will continue to assist with placement needs. PT/OT to see the patient.   Expected Discharge Plan: Skilled Nursing Facility Barriers to Discharge: Continued Medical Work up   Patient Goals and CMS Choice   CMS Medicare.gov Compare Post Acute Care list provided to:: Other (Comment Required)(Daughter) Choice offered to / list presented to : Adult Children  Expected Discharge Plan and Services Expected Discharge Plan: Skilled Nursing Facility     Post Acute Care Choice: Skilled Nursing Facility Living arrangements for the past 2 months: Single Family Home                                      Prior Living Arrangements/Services Living arrangements for the past 2 months: Single Family Home Lives with:: Adult Children, Minor Children Patient language and need for interpreter reviewed:: No Do you feel safe going back to the place where you live?: Yes      Need for Family Participation in Patient Care: Yes (Comment) Care giver support system in place?: Yes (comment) Current home services: DME Criminal Activity/Legal Involvement Pertinent to Current Situation/Hospitalization: No - Comment as  needed  Activities of Daily Living Home Assistive Devices/Equipment: Walker (specify type), Raised toilet seat with rails, Eyeglasses(rolling walker with a seat, lift chair in tub) ADL Screening (condition at time of admission) Patient's cognitive ability adequate to safely complete daily activities?: No Is the patient deaf or have difficulty hearing?: Yes Does the patient have difficulty seeing, even when wearing glasses/contacts?: Yes(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) Does the patient have difficulty concentrating, remembering, or making decisions?: Yes Patient able to express need for assistance with ADLs?: No Does the patient have difficulty dressing or bathing?: Yes Independently performs ADLs?: No Communication: Independent Dressing (OT): Needs assistance Is this a change from baseline?: Change from baseline, expected to last >3 days Grooming: Needs assistance Is this a change from baseline?: Change from baseline, expected to last >3 days Feeding: Needs assistance Is this a change from baseline?: Change from baseline, expected to last >3 days Bathing: Needs assistance Is this a change from baseline?: Pre-admission baseline Toileting: Needs assistance Is this a change from baseline?: Change from baseline, expected to last >3days In/Out Bed: Dependent Is this a change from baseline?: Change from baseline, expected to last >3 days Walks in Home: Dependent Is this a change from baseline?: Change from baseline, expected to last >3 days Does the patient have difficulty walking or climbing stairs?: Yes Weakness of Legs: Left Weakness of Arms/Hands: None  Permission Sought/Granted Permission sought to  share information with : Case Manager, Customer service manager    Share Information with NAME: Tiffany [name] Daughter  Permission granted to share info w AGENCY: SNF's  Permission granted to share info w Relationship: Daughter  Permission granted to share info w Contact  Information: [phone]  Emotional Assessment Appearance:: Appears stated age Attitude/Demeanor/Rapport: Unable to Assess Affect (typically observed): Unable to Assess Orientation: : (Disoriented x4) Alcohol / Substance Use: Not Applicable Psych Involvement: No (comment)  Admission diagnosis:  Closed right hip fracture (HCC) [S72.001A] Closed left hip fracture ([place]) [S72.002A] Closed intertrochanteric fracture of hip, left, initial encounter ([place]) [S72.142A] S/p left hip fracture [Z87.81] Atrial fibrillation, unspecified type [facility]) [I48.91] Patient Active Problem List   Diagnosis Date Noted  . S/p left hip fracture 05/06/2019  . New onset a-fib ([place]) 05/06/2019  . Cellulitis 12/12/2016  . Cellulitis of right lower extremity 12/12/2016  . Vaginal prolapse 12/12/2016  . Difficulty walking    PCP:  System, Pcp Not In Pharmacy:   [address], [place] [address] [place] [address] Phone: [phone] Fax: [phone]     Social Determinants of Health (SDOH) Interventions    Readmission Risk Interventions No flowsheet data found.

## 2019-05-08 NOTE — Progress Notes (Signed)
Spoke with primary RN re: IV start. Patient has critical drips x 2 and harsh on the veins. Recommended RN to speak with MD for central line/PICC. Will follow up.

## 2019-05-08 NOTE — Progress Notes (Addendum)
PROGRESS NOTE  BARRY CULVERHOUSE WEX:[id number] DOB: 30-Jan-1924 DOA: 05/06/2019 PCP: System, Pcp Not In  HPI/Recap of past 24 hours: SHAKEELA RABADAN is a 86 y.o. female with medical history of anemia who fell at home on 3/16 and presented for pain in the left hip on 3/17.   Orthopedic surgery consulted with plan for left hip repair on 05/07/2019.  Post left hip repair by Dr. Susa Simmonds on 05/07/19. 700cc blood loss, received 2U PRBCs.  Transferred to ICU due to hypotension requiring pressors. She is on levophed.  05/08/19:  Seen and examined.  Alert but confused this morning.    Assessment/Plan: Active Problems:   S/p left hip fracture   New onset a-fib (HCC)  Left hip fracture post fall, POD #1 post repair on 3/18 Dr. Susa Simmonds. Care directed by ortho Received 2U PRBCs H&H 9.0, no overt bleeding, monitor Sq lovenox for DVT ppx TOC consulted to assist with possible SNF placement/home health DME needs once stable  Newly diagnosed acute systolic CHF/cardiomyopathy Etiology is unclear 2D echo shows LVEF 20-25% Seen by cardiology. Blood pressure too soft for beta-blocker or ARB or Entresto at this point  Strict I&O and daily weight Net + 2.2L Continue to monitor volume status  Newly diagnosed A. Fib Started on amiodarone drip CHADSVASc 3 Not a candidate for anticoagulation due to recurrent falls  Hypotension requiring pressors suspect 2/2 to acute blood loss  Received neo overnight, started on levophed this AM, wean as tolerated Has received LR boluses judiciously due to acute systolic CHF 20-25% Maintain MAP greater than 65  Acute blood loss anemia post surgery Baseline Hg 10-11 700 cc blood loss Received 2U PRBCs Hg 9.0 Monitor H&H  Hypomagnesemia Mg2+ 1.5 repleted  Physical debility/ambulatory function PT OT to assess when stable. Fall precautions.  Dementia Minimize distractors Reorient Fall precautions   Code Status:  DNR  Family Communication: We will call family to  give updates.  Disposition Plan: Patient is from home.  Anticipate discharge to SNF once she is hemodynamically stable no longer requiring pressors and orthopedic surgery signs off.  Barrier to discharge hypotension requiring pressors.  Assessment pending for PT OT.   Consultants:  Orthopedic surgeon  Cardiology  Procedures:  Left hip repair on 05/07/2019.  Antimicrobials:  None  DVT prophylaxis: SQ lovenox   Objective: Vitals:   05/08/19 1000 05/08/19 1200 05/08/19 1250 05/08/19 1300  BP: (!) 85/47 (!) 86/62 120/72 116/67  Pulse: 91 84 91 91  Resp:      Temp:  98.7 F (37.1 C)    TempSrc:  Oral    SpO2: 94% 98% 100% 94%  Weight:      Height:        Intake/Output Summary (Last 24 hours) at 05/08/2019 1339 Last data filed at 05/08/2019 9381 Gross per 24 hour  Intake 3498.91 ml  Output 900 ml  Net 2598.91 ml   Filed Weights   05/07/19 1420 05/07/19 1427 05/08/19 0444  Weight: 61.2 kg 61.2 kg 58.1 kg    Exam:  . General: 86 y.o. year-old female  Frail alert and confused in the setting of dementia. . Cardiovascular: Irregular rate and rhythm. No rubs or gallops . Respiratory: Mild rales at bases. No wheezing. . Abdomen: soft non tender normal bowel sounds. . Musculoskeletal: Bilateral trace LE edema . Psychiatry: Unable to assess mood due to confusion   Data Reviewed: CBC: Recent Labs  Lab 05/06/19 1411 05/07/19 0530 05/07/19 2226 05/08/19 0140 05/08/19 0828  WBC  8.1 7.4 14.9* 11.4*  --   NEUTROABS 5.0  --   --   --   --   HGB 10.0* 9.0* 9.8* 9.3* 9.0*  HCT 31.7* 27.7* 31.3* 29.1* 28.3*  MCV 103.3* 102.6* 102.0* 100.3*  --   PLT 128* 124* 109* 92*  --    Basic Metabolic Panel: Recent Labs  Lab 05/06/19 1411 05/07/19 0530 05/07/19 2226 05/08/19 0140  NA 139 139 141 141  K 3.9 3.8 4.4 4.6  CL 105 104 104 105  CO2 27 28 25 30   GLUCOSE 111* 101* 169* 127*  BUN 32* 27* 24* 25*  CREATININE 0.68 0.69 0.77 0.79  CALCIUM 8.9 8.6* 8.4* 8.5*  MG   --   --   --  1.5*  PHOS  --   --   --  3.3   GFR: Estimated Creatinine Clearance: 37.9 mL/min (by C-G formula based on SCr of 0.79 mg/dL). Liver Function Tests: No results for input(s): AST, ALT, ALKPHOS, BILITOT, PROT, ALBUMIN in the last 168 hours. No results for input(s): LIPASE, AMYLASE in the last 168 hours. No results for input(s): AMMONIA in the last 168 hours. Coagulation Profile: Recent Labs  Lab 05/06/19 1411  INR 1.2   Cardiac Enzymes: No results for input(s): CKTOTAL, CKMB, CKMBINDEX, TROPONINI in the last 168 hours. BNP (last 3 results) No results for input(s): PROBNP in the last 8760 hours. HbA1C: No results for input(s): HGBA1C in the last 72 hours. CBG: No results for input(s): GLUCAP in the last 168 hours. Lipid Profile: No results for input(s): CHOL, HDL, LDLCALC, TRIG, CHOLHDL, LDLDIRECT in the last 72 hours. Thyroid Function Tests: Recent Labs    05/07/19 0530  TSH 1.361  FREET4 0.94   Anemia Panel: No results for input(s): VITAMINB12, FOLATE, FERRITIN, TIBC, IRON, RETICCTPCT in the last 72 hours. Urine analysis:    Component Value Date/Time   COLORURINE YELLOW 12/11/2016 2347   APPEARANCEUR CLEAR 12/11/2016 2347   LABSPEC 1.010 12/11/2016 2347   PHURINE 6.0 12/11/2016 2347   GLUCOSEU NEGATIVE 12/11/2016 2347   HGBUR SMALL (A) 12/11/2016 2347   BILIRUBINUR NEGATIVE 12/11/2016 2347   KETONESUR NEGATIVE 12/11/2016 2347   PROTEINUR NEGATIVE 12/11/2016 2347   NITRITE NEGATIVE 12/11/2016 2347   LEUKOCYTESUR NEGATIVE 12/11/2016 2347   Sepsis Labs: @LABRCNTIP (procalcitonin:4,lacticidven:4)  ) Recent Results (from the past 240 hour(s))  Respiratory Panel by RT PCR (Flu A&B, Covid) - Nasopharyngeal Swab     Status: None   Collection Time: 05/06/19  2:50 PM   Specimen: Nasopharyngeal Swab  Result Value Ref Range Status   SARS Coronavirus 2 by RT PCR NEGATIVE NEGATIVE Final    Comment: (NOTE) SARS-CoV-2 target nucleic acids are NOT DETECTED. The  SARS-CoV-2 RNA is generally detectable in upper respiratoy specimens during the acute phase of infection. The lowest concentration of SARS-CoV-2 viral copies this assay can detect is 131 copies/mL. A negative result does not preclude SARS-Cov-2 infection and should not be used as the sole basis for treatment or other patient management decisions. A negative result may occur with  improper specimen collection/handling, submission of specimen other than nasopharyngeal swab, presence of viral mutation(s) within the areas targeted by this assay, and inadequate number of viral copies (<131 copies/mL). A negative result must be combined with clinical observations, patient history, and epidemiological information. The expected result is Negative. Fact Sheet for Patients:  Fact Sheet for Healthcare Providers:  05/08/19 This test is not yet ap proved or cleared by the https://www.moore.com/  States FDA and  has been authorized for detection and/or diagnosis of SARS-CoV-2 by FDA under an Emergency Use Authorization (EUA). This EUA will remain  in effect (meaning this test can be used) for the duration of the COVID-19 declaration under Section 564(b)(1) of the Act, 21 U.S.C. section 360bbb-3(b)(1), unless the authorization is terminated or revoked sooner.    Influenza A by PCR NEGATIVE NEGATIVE Final   Influenza B by PCR NEGATIVE NEGATIVE Final    Comment: (NOTE) The Xpert Xpress SARS-CoV-2/FLU/RSV assay is intended as an aid in  the diagnosis of influenza from Nasopharyngeal swab specimens and  should not be used as a sole basis for treatment. Nasal washings and  aspirates are unacceptable for Xpert Xpress SARS-CoV-2/FLU/RSV  testing. Fact Sheet for Patients: PinkCheek.be Fact Sheet for Healthcare Providers: GravelBags.it This test is not yet approved or cleared by the [place] FDA and  has been authorized for detection and/or diagnosis of SARS-CoV-2 by  FDA under an Emergency Use Authorization (EUA). This EUA will remain  in effect (meaning this test can be used) for the duration of the  Covid-19 declaration under Section 564(b)(1) of the Act, 21  U.S.C. section 360bbb-3(b)(1), unless the authorization is  terminated or revoked. Performed at [facility], [place] [address], [place], [place] [address]   Surgical pcr screen     Status: Abnormal   Collection Time: 05/07/19  4:55 AM   Specimen: Nasal Mucosa; Nasal Swab  Result Value Ref Range Status   MRSA, PCR NEGATIVE NEGATIVE Final   Staphylococcus aureus POSITIVE (A) NEGATIVE Final    Comment: (NOTE) The Xpert SA Assay (FDA approved for NASAL specimens in patients 52 years of age and older), is one component of a comprehensive surveillance program. It is not intended to diagnose infection nor to guide or monitor treatment. Performed at [facility], [place] [address], [place], [place] [address]       Studies: DG CHEST PORT 1 VIEW  Result Date: 05/08/2019 CLINICAL DATA:  History of dementia.  Fall EXAM: PORTABLE CHEST 1 VIEW COMPARISON:  Radiograph 05/07/2019 FINDINGS: Stable enlarged cardiac silhouette. Ectatic aorta. Small RIGHT effusion. LEFT lung clear. No pneumothorax. No fracture. IMPRESSION: Small RIGHT effusion.  Otherwise no acute cardiopulmonary findings. Electronically Signed   By: Suzy Bouchard M.D.   On: 05/08/2019 09:25   DG CHEST PORT 1 VIEW  Result Date: 05/07/2019 CLINICAL DATA:  Pulmonary edema and altered mental status EXAM: PORTABLE CHEST 1 VIEW COMPARISON:  05/06/2019 FINDINGS: Patient is extremely rotated to the right. The left lung is clear. The visible portion of the right lung is unremarkable. Mild cardiomegaly. IMPRESSION: Cardiomegaly without focal airspace disease. The patient is extremely rotated to the right. Electronically Signed    By: Ulyses Jarred M.D.   On: 05/07/2019 22:28   DG C-Arm 1-60 Min-No Report  Result Date: 05/07/2019 Fluoroscopy was utilized by the requesting physician.  No radiographic interpretation.   DG FEMUR MIN 2 VIEWS LEFT  Result Date: 05/07/2019 CLINICAL DATA:  86 year old female status post left femoral ORIF. EXAM: LEFT FEMUR 2 VIEWS COMPARISON:  Radiograph dated 05/06/2019. FINDINGS: Six fluoroscopic intraoperative images provided. The total fluoroscopic time is 26 seconds. Left femoral intramedullary rod and transcervical screw noted. IMPRESSION: Status post left femoral ORIF. Electronically Signed   By: Anner Crete M.D.   On: 05/07/2019 20:44    Scheduled Meds: . Chlorhexidine Gluconate Cloth  6 each Topical Daily  . docusate sodium  100 mg  Oral BID  . enoxaparin (LOVENOX) injection  40 mg Subcutaneous Q24H  . furosemide  20 mg Intravenous Q6H  . influenza vaccine adjuvanted  0.5 mL Intramuscular Tomorrow-1000  . mouth rinse  15 mL Mouth Rinse BID  . mupirocin ointment  1 application Nasal BID    Continuous Infusions: . sodium chloride Stopped (05/08/19 0803)  . sodium chloride    . albumin human 25 g (05/08/19 1124)  . amiodarone 60 mg/hr (05/08/19 1045)   Followed by  . amiodarone    .  ceFAZolin (ANCEF) IV Stopped (05/08/19 0606)  . norepinephrine (LEVOPHED) Adult infusion 2 mcg/min (05/08/19 7616)     LOS: 2 days     Darlin Drop, MD [facility] Pager [phone]  If 7PM-7AM, please contact night-coverage [email or web address] Password [email or web address] 05/08/2019, 1:39 PM

## 2019-05-08 NOTE — Progress Notes (Signed)
OT Cancellation Note  Patient Details Name: Tiffany [name] MRN: [id number] DOB: 1923-06-18   Cancelled Treatment:    Reason Eval/Treat Not Completed: Medical issues which prohibited therapy. Low BP will attempt OT eval on different date and time.  Allia Wiltsey 05/08/2019, 12:36 PM

## 2019-05-08 NOTE — Progress Notes (Signed)
     Tiffany [name] is a 86 y.o. female   Orthopaedic diagnosis: Left intertrochanteric hip fracture  Subjective: Patient is resting comfortably.  She is getting Toradol from the nurses.  She is no longer on pressors per nursing.  They had to change her dressing when they rolled her as she had some saturation.  She is awake and alert.  Objectyive: Vitals:   05/08/19 1250 05/08/19 1300  BP: 120/72 116/67  Pulse: 91 91  Resp:    Temp:    SpO2: 100% 94%     Exam: Awake and alert Respirations even and unlabored No acute distress  Dressing of the lateral hip intact.  Mild shadowing.  Distal incision dressing intact with minimal shadowing.  Assessment: Postop day 1 status post open treatment intramedullary nail of left intertrochanteric hip fracture.  700 cc blood loss and 2 units of PRBCs during surgery.  Transferred to ICU after surgery.   Plan: She is doing well.  She is off pressors.  Currently hemodynamically stable.  Appears to have minimal pain.  We will plan for touchdown weightbearing of the left lower extremity with physical therapy if she mobilizes.  Likely will need transfer to skilled nursing facility.  Limit narcotic pain medicine.  Okay to restart DVT prophylaxis once hemodynamically stable and hemoglobin stabilizes.  Okay for discharge from orthopedic standpoint once this occurs.   Nicki Guadalajara, MD

## 2019-05-08 NOTE — Consult Note (Addendum)
NAME:  Tiffany [name], MRN:  [id number], DOB:  09/13/1923, LOS: 2 ADMISSION DATE:  05/06/2019, CONSULTATION DATE:  05/07/2019 REFERRING MD:  Dr. Nevada Crane, CHIEF COMPLAINT:  Hypotension    Brief History   86 year old female found down at home secondary to mechanical fall which resulted in left hip fracture. Postoperatively developed significant hypotension requiring initiation of pressors  History of present illness   Tiffany [name] is a 86 year old female with a past medical history significant for dementia and anemia who presented to the emergency department after being found down at home secondary to mechanical fall which resulted in left hip fracture. Patient underwent left open reduction internal fixation of left proximal femur hip fracture with Dr. Lucia Gaskins 3/18.   Postoperatively patient patient was seen with hypertension and atrial fibrillation with rapid ventricular response. PCCM consulted for assistance in management, patient was initiated on peripheral neomorning of 3/18  Past Medical History  Dementia  Anemia  Significant Hospital Events   3/17 Admitted   Consults:  Orthopedics   Procedures:  ORIF Left femur hip fracture 3/18  Significant Diagnostic Tests:    Micro Data:  COVID 3/17 >> Negative  MRSA PCR 3/18 >> Positive staph aureus   Antimicrobials:  Ancef x3 surgical ppx  Interim history/subjective:  Lying in bed alert but significantly confused, unsure what her baseline is given history od dementia.   Objective   Blood pressure (!) 93/47, pulse 98, temperature (!) 100.8 F (38.2 C), temperature source Axillary, resp. rate 14, height 5\' 4"  (1.626 m), weight 58.1 kg, SpO2 (!) 89 %.        Intake/Output Summary (Last 24 hours) at 05/08/2019 0833 Last data filed at 05/08/2019 0700 Gross per 24 hour  Intake 2878.87 ml  Output 900 ml  Net 1978.87 ml   Filed Weights   05/07/19 1420 05/07/19 1427 05/08/19 0444  Weight: 61.2 kg 61.2 kg 58.1 kg    Examination:  General: Pleasantly confused elderly female, denies any acute complaints, in NAD  HEENT: [place]/AT, MM pink/moist, PERRL,  Neuro: Alert but disoriented x3, able to follow commands, non-focal  CV: s1s2 regular rate and rhythm, no murmur, rubs, or gallops,  PULM:  Clear to ascultation bilaterally, no added breath sounds, no increased work of breathing  GI: soft, bowel sounds active in all 4 quadrants, non-tender, non-distended Extremities: warm/dry, no edema, dressing to left hip clean dry and intact,  Skin: no rashes or lesions  Resolved Hospital Problem list     Assessment & Plan:  Hypotension  -Secondary to hypovolemia due to acute blood loss and poor oral intake  P: Wean Neo in the setting of severely reduced cardiac function May need low dose Levo if blood pressure doesn't tolerate discontinuation of pressors  Repeat H&H now Repeat CXR  Closely monitor volume status  May need Further IV hydration but need to balance against volume overload   Systolic congestive heart failure  -ECHO 3/17 with EF of 20-25% of note rhythm at time of ECHO was A-fib RVR P: Continuous telemetry  Close monitoring of volume  Daily weight  Can consider repeat ECHO to assess EF that patient is now back in NSR  Strict intake and output   Left femur hip fracture  -S/P ORIF 3/18 P: Management per ortho  Minimize sedation  PT/OT evals  Rest of management per primary    Best practice:  Diet: NPO, may advance once passes swallow eval  Pain/Anxiety/Delirium protocol (if indicated): PRNs VAP protocol (if indicated): N/A DVT  prophylaxis: Lovenox  GI prophylaxis: PPI Glucose control: Monitor  Mobility: PT/OT eval  Code Status: Full Family Communication: Will attempt to update, have had difficulty locating family  Disposition: ICU   Labs   CBC: Recent Labs  Lab 05/06/19 1411 05/07/19 0530 05/07/19 2226 05/08/19 0140  WBC 8.1 7.4 14.9* 11.4*  NEUTROABS 5.0  --   --   --   HGB 10.0* 9.0* 9.8*  9.3*  HCT 31.7* 27.7* 31.3* 29.1*  MCV 103.3* 102.6* 102.0* 100.3*  PLT 128* 124* 109* 92*    Basic Metabolic Panel: Recent Labs  Lab 05/06/19 1411 05/07/19 0530 05/07/19 2226 05/08/19 0140  NA 139 139 141 141  K 3.9 3.8 4.4 4.6  CL 105 104 104 105  CO2 27 28 25 30   GLUCOSE 111* 101* 169* 127*  BUN 32* 27* 24* 25*  CREATININE 0.68 0.69 0.77 0.79  CALCIUM 8.9 8.6* 8.4* 8.5*  MG  --   --   --  1.5*  PHOS  --   --   --  3.3   GFR: Estimated Creatinine Clearance: 37.9 mL/min (by C-G formula based on SCr of 0.79 mg/dL). Recent Labs  Lab 05/06/19 1411 05/07/19 0530 05/07/19 2226 05/08/19 0140  WBC 8.1 7.4 14.9* 11.4*    Liver Function Tests: No results for input(s): AST, ALT, ALKPHOS, BILITOT, PROT, ALBUMIN in the last 168 hours. No results for input(s): LIPASE, AMYLASE in the last 168 hours. No results for input(s): AMMONIA in the last 168 hours.  ABG No results found for: PHART, PCO2ART, PO2ART, HCO3, TCO2, ACIDBASEDEF, O2SAT   Coagulation Profile: Recent Labs  Lab 05/06/19 1411  INR 1.2    Cardiac Enzymes: No results for input(s): CKTOTAL, CKMB, CKMBINDEX, TROPONINI in the last 168 hours.  HbA1C: No results found for: HGBA1C  CBG: No results for input(s): GLUCAP in the last 168 hours.  Review of Systems:   Unable to gather due to patients confusion   Past Medical History  She,  has a past medical history of Anemia.   Surgical History    Past Surgical History:  Procedure Laterality Date  . ABDOMINAL HYSTERECTOMY       Social History   reports that she has never smoked. She has never used smokeless tobacco. She reports that she does not drink alcohol or use drugs.   Family History   Her family history includes Hypertension in an other family member.   Allergies Allergies  Allergen Reactions  . Strawberry Flavor Hives and Rash    Strawberries     Home Medications  Prior to Admission medications   Medication Sig Start Date End Date  Taking? Authorizing Provider  diazepam (VALIUM) 5 MG tablet Take 5 mg by mouth once.   Yes [provider]  HYDROcodone-acetaminophen (NORCO/VICODIN) 5-325 MG tablet Take 1 tablet by mouth once.   Yes [provider]     Critical care time:    Performed by: 05/08/19  Total critical care time: 40 minutes  Critical care time was exclusive of separately billable procedures and treating other patients.  Critical care was necessary to treat or prevent imminent or life-threatening deterioration.  Critical care was time spent personally by me on the following activities: development of treatment plan with patient and/or surrogate as well as nursing, discussions with consultants, evaluation of patient's response to treatment, examination of patient, obtaining history from patient or surrogate, ordering and performing treatments and interventions, ordering and review of laboratory studies, ordering and  review of radiographic studies, pulse oximetry and re-evaluation of patient's condition.  Delfin Gant, NP-C [facility] Contact / Pager information can be found on Amion  05/08/2019, 9:01 AM    PCCM Attending:   86 yo, chronic systolic heart failure, EF 20%, fall at home with left hip fracture take to OR for fixation. She got 2U PRBCs and 3L fluid. Now hypotensive. Was put on neo overnight.   BP (!) 80/43   Pulse 92   Temp 98.5 F (36.9 C) (Oral)   Resp 14   Ht 5\' 4"  (1.626 m)   Wt 58.1 kg   SpO2 98%   BMI 21.99 kg/m   Gen: elderly fm, pleasant ENT: trache midline, JVP is visible  Heart: RRR, s1 s2, systolic murmur  Lungs: diminshed in base   Labs reviewed, mag 1.5 CXR: small effusion, very large heart, The patient's images have been independently reviewed by me.    A:  Cardiogenic Shock, LVEF decompensated by volume and neo use  Atrial fibrillation with RVR  Left hip fracture s/p fixation  DNR   P: Stop neo, it only  increases her afterload Stop additional fluids Start NEPI Amiodarone for afib  Stop giving metoprolol with pressors running   She needs another IV  May benefit from little diuresis 20mg  lasix   This patient is critically ill with multiple organ system failure; which, requires frequent high complexity decision making, assessment, support, evaluation, and titration of therapies. This was completed through the application of advanced monitoring technologies and extensive interpretation of multiple databases. During this encounter critical care time was devoted to patient care services described in this note for 32 minutes.   , DO [facility] 05/08/2019 11:04 AM

## 2019-05-08 NOTE — Progress Notes (Signed)
PT Cancellation Note  Patient Details Name: Tiffany [name] MRN: [id number] DOB: 08/14/1923   Cancelled Treatment:     PT order received but eval deferred 2* pt low BP.  Will follow.  Mauro Kaufmann PT Acute Rehabilitation Services Pager [phone] Office [phone]    [facility] 05/08/2019, 9:38 AM

## 2019-05-08 NOTE — Progress Notes (Addendum)
eLink Physician-Brief Progress Note Patient Name: Tiffany [name] DOB: Jun 12, 1923 MRN: [id number]   Date of Service  05/08/2019  HPI/Events of Note  Afib with RVR 150s-170s. SBP 91/67. Temp 100.8.   eICU Interventions  Will give a small fluid bolus of 500cc LR, again exercising caution given very low EF. Will also treat fever with tylenol. These may help significantly with her RVR. Also ordered further metoprolol IV pushes for if the RVR persists despite these interventions.  Also will replete Mg.     Intervention Category Major Interventions: Arrhythmia - evaluation and management Intermediate Interventions: Hypotension - evaluation and management  Marveen Reeks Hilja Kintzel 05/08/2019, 4:02 AM

## 2019-05-08 NOTE — Plan of Care (Signed)
Patient has been confused and combative when care provided. Patient disoriented to person, place, time, and situation and thus appears fearful and upset due to lack of understanding. Unable to re-orient patient. Patient has had pain, but pain management limited due to hypotension. Likewise, afib with RVR requiring IV push Metoprolol, but blood pressure too soft to try Cardizem. When patient calmer, her oxygen saturation level is in mid-upper 90's on room air. Given total of one liter LR bolus (2- doses) over this shift since coming to ICU from PACU. Communicating with E-link physician throughout shift.

## 2019-05-09 DIAGNOSIS — I420 Dilated cardiomyopathy: Secondary | ICD-10-CM

## 2019-05-09 DIAGNOSIS — Z7189 Other specified counseling: Secondary | ICD-10-CM

## 2019-05-09 DIAGNOSIS — Z8781 Personal history of (healed) traumatic fracture: Secondary | ICD-10-CM

## 2019-05-09 DIAGNOSIS — Z515 Encounter for palliative care: Secondary | ICD-10-CM

## 2019-05-09 DIAGNOSIS — R531 Weakness: Secondary | ICD-10-CM

## 2019-05-09 DIAGNOSIS — R578 Other shock: Secondary | ICD-10-CM

## 2019-05-09 DIAGNOSIS — D500 Iron deficiency anemia secondary to blood loss (chronic): Secondary | ICD-10-CM

## 2019-05-09 LAB — CBC WITH DIFFERENTIAL/PLATELET
Abs Immature Granulocytes: 0.05 10*3/uL (ref 0.00–0.07)
Basophils Absolute: 0 10*3/uL (ref 0.0–0.1)
Basophils Relative: 0 %
Eosinophils Absolute: 0 10*3/uL (ref 0.0–0.5)
Eosinophils Relative: 0 %
HCT: 24.4 % — ABNORMAL LOW (ref 36.0–46.0)
Hemoglobin: 7.9 g/dL — ABNORMAL LOW (ref 12.0–15.0)
Immature Granulocytes: 1 %
Lymphocytes Relative: 15 %
Lymphs Abs: 1.6 10*3/uL (ref 0.7–4.0)
MCH: 31.3 pg (ref 26.0–34.0)
MCHC: 32.4 g/dL (ref 30.0–36.0)
MCV: 96.8 fL (ref 80.0–100.0)
Monocytes Absolute: 1.7 10*3/uL — ABNORMAL HIGH (ref 0.1–1.0)
Monocytes Relative: 17 %
Neutro Abs: 7.1 10*3/uL (ref 1.7–7.7)
Neutrophils Relative %: 67 %
Platelets: 131 10*3/uL — ABNORMAL LOW (ref 150–400)
RBC: 2.52 MIL/uL — ABNORMAL LOW (ref 3.87–5.11)
RDW: 15.8 % — ABNORMAL HIGH (ref 11.5–15.5)
WBC: 10.4 10*3/uL (ref 4.0–10.5)
nRBC: 0 % (ref 0.0–0.2)

## 2019-05-09 LAB — CBC
HCT: 21.6 % — ABNORMAL LOW (ref 36.0–46.0)
Hemoglobin: 7 g/dL — ABNORMAL LOW (ref 12.0–15.0)
MCH: 32.7 pg (ref 26.0–34.0)
MCHC: 32.4 g/dL (ref 30.0–36.0)
MCV: 100.9 fL — ABNORMAL HIGH (ref 80.0–100.0)
Platelets: 120 10*3/uL — ABNORMAL LOW (ref 150–400)
RBC: 2.14 MIL/uL — ABNORMAL LOW (ref 3.87–5.11)
RDW: 14.3 % (ref 11.5–15.5)
WBC: 11.9 10*3/uL — ABNORMAL HIGH (ref 4.0–10.5)
nRBC: 0 % (ref 0.0–0.2)

## 2019-05-09 LAB — URINALYSIS, ROUTINE W REFLEX MICROSCOPIC
Bacteria, UA: NONE SEEN
Bilirubin Urine: NEGATIVE
Glucose, UA: NEGATIVE mg/dL
Ketones, ur: NEGATIVE mg/dL
Leukocytes,Ua: NEGATIVE
Nitrite: NEGATIVE
Protein, ur: NEGATIVE mg/dL
Specific Gravity, Urine: 1.023 (ref 1.005–1.030)
pH: 5 (ref 5.0–8.0)

## 2019-05-09 LAB — BASIC METABOLIC PANEL
Anion gap: 10 (ref 5–15)
BUN: 31 mg/dL — ABNORMAL HIGH (ref 8–23)
CO2: 27 mmol/L (ref 22–32)
Calcium: 8.6 mg/dL — ABNORMAL LOW (ref 8.9–10.3)
Chloride: 101 mmol/L (ref 98–111)
Creatinine, Ser: 1.08 mg/dL — ABNORMAL HIGH (ref 0.44–1.00)
GFR calc Af Amer: 51 mL/min — ABNORMAL LOW (ref >60)
GFR calc non Af Amer: 44 mL/min — ABNORMAL LOW (ref >60)
Glucose, Bld: 129 mg/dL — ABNORMAL HIGH (ref 70–99)
Potassium: 3.7 mmol/L (ref 3.5–5.1)
Sodium: 138 mmol/L (ref 135–145)

## 2019-05-09 LAB — PREPARE RBC (CROSSMATCH)

## 2019-05-09 LAB — LACTIC ACID, PLASMA: Lactic Acid, Venous: 1.5 mmol/L (ref 0.5–1.9)

## 2019-05-09 LAB — PROCALCITONIN: Procalcitonin: 0.76 ng/mL

## 2019-05-09 LAB — MAGNESIUM: Magnesium: 1.7 mg/dL (ref 1.7–2.4)

## 2019-05-09 MED ORDER — HYDROCORTISONE NA SUCCINATE PF 100 MG IJ SOLR
50.0000 mg | Freq: Four times a day (QID) | INTRAMUSCULAR | Status: DC
  Administered 2019-05-09 – 2019-05-12 (×11): 50 mg via INTRAVENOUS
  Filled 2019-05-09 (×13): qty 2

## 2019-05-09 MED ORDER — SODIUM CHLORIDE 0.9 % IV SOLN
2.0000 g | INTRAVENOUS | Status: DC
  Administered 2019-05-09: 2 g via INTRAVENOUS
  Filled 2019-05-09: qty 2

## 2019-05-09 MED ORDER — SODIUM CHLORIDE 0.9% IV SOLUTION: Freq: Once | INTRAVENOUS | Status: AC

## 2019-05-09 MED ORDER — MORPHINE SULFATE (PF) 2 MG/ML IV SOLN
0.5000 mg | Freq: Once | INTRAVENOUS | Status: AC
  Administered 2019-05-09: 0.5 mg via INTRAVENOUS
  Filled 2019-05-09: qty 1

## 2019-05-09 MED ORDER — SODIUM CHLORIDE (PF) 0.9 % IJ SOLN
INTRAMUSCULAR | Status: AC
  Filled 2019-05-09: qty 50

## 2019-05-09 MED ORDER — ENOXAPARIN SODIUM 30 MG/0.3ML ~~LOC~~ SOLN
30.0000 mg | SUBCUTANEOUS | Status: DC
  Administered 2019-05-09: 30 mg via SUBCUTANEOUS
  Filled 2019-05-09: qty 0.3

## 2019-05-09 MED ORDER — VANCOMYCIN HCL 1250 MG/250ML IV SOLN
1250.0000 mg | Freq: Once | INTRAVENOUS | Status: AC
  Administered 2019-05-09: 1250 mg via INTRAVENOUS
  Filled 2019-05-09: qty 250

## 2019-05-09 MED ORDER — LACTATED RINGERS IV BOLUS
250.0000 mL | Freq: Once | INTRAVENOUS | Status: AC
  Administered 2019-05-09: 250 mL via INTRAVENOUS

## 2019-05-09 MED ORDER — VANCOMYCIN HCL 1250 MG/250ML IV SOLN: 1250.0000 mg | INTRAVENOUS | Status: DC

## 2019-05-09 NOTE — Evaluation (Signed)
Physical Therapy Evaluation Patient Details Name: Tiffany [name] MRN: [id number] DOB: Jul 20, 1923 Today's Date: 05/09/2019   History of Present Illness  86 y/o female admitted on 3/17 after a mechanical fall which resulted in a left hip fracture s/p fixation.  Post operatively had hypotension requiring initiation of pressors.  Clinical Impression  Pt admitted with above diagnosis. Pt currently with functional limitations due to the deficits listed below (see PT Problem List). Pt will benefit from skilled PT to increase their independence and safety with mobility to allow discharge to the venue listed below.  Pt resistant to movement and appears in pain so evaluation limited.  Pt also remains on pressors so RN to check to see if pt can receive something to assist with pain.  Pt also not following commands and not orientated.  Per notes, pt also with poor prognosis.  Pt will need increased assist upon d/c.  Will follow along to trial PT.     Follow Up Recommendations SNF;Supervision/Assistance - 24 hour(trial PT however pt may just need higher level care depending on cognition/progress)    Equipment Recommendations  Hospital bed;Wheelchair (measurements PT);Wheelchair cushion (measurements PT)(also lift; if home)    Recommendations for Other Services       Precautions / Restrictions Precautions Precautions: Fall Restrictions Weight Bearing Restrictions: Yes LLE Weight Bearing: Touchdown weight bearing      Mobility  Bed Mobility Overal bed mobility: Needs Assistance Bed Mobility: Rolling Rolling: Total assist;+2 for physical assistance         General bed mobility comments: attempted to assist pt with repositioning (pt up against right rail on arrival), pt not able to assist and not allowing assist so RN into room to assist with repositioning, pt remains with low BP and appears in pain, also resistant to movement so did not perform further mobility  Transfers                    Ambulation/Gait                Stairs            Wheelchair Mobility    Modified Rankin (Stroke Patients Only)       Balance Overall balance assessment: History of Falls                                           Pertinent Vitals/Pain      Home Living Family/patient expects to be discharged to:: Skilled nursing facility Living Arrangements: Children               Additional Comments: pt poor historian, per chart, pt from home and ambulating with rollator    Prior Function Level of Independence: Needs assistance   Gait / Transfers Assistance Needed: ambulatory with rollator           Hand Dominance        Extremity/Trunk Assessment        Lower Extremity Assessment Lower Extremity Assessment: Generalized weakness;LLE deficits/detail LLE Deficits / Details: pt keeps L LE in flexion, adduction and internally rotated; pt resistant to repositioning however was able to place pillow between knees LLE: Unable to fully assess due to pain       Communication   Communication: No difficulties  Cognition Arousal/Alertness: Awake/alert Behavior During Therapy: Agitated;Restless Overall Cognitive Status: No family/caregiver present to determine baseline cognitive functioning  General Comments: pt not orientated so reoriented to place and situation, pt not following simple commands or unwilling, appears resistant to assist due to ?pain      General Comments      Exercises     Assessment/Plan    PT Assessment Patient needs continued PT services  PT Problem List Decreased strength;Decreased mobility;Decreased balance;Decreased knowledge of use of DME;Decreased knowledge of precautions;Pain;Decreased cognition;Decreased activity tolerance       PT Treatment Interventions Gait training;DME instruction;Therapeutic exercise;Functional mobility training;Therapeutic  activities;Balance training;Patient/family education    PT Goals (Current goals can be found in the Care Plan section)  Acute Rehab PT Goals PT Goal Formulation: Patient unable to participate in goal setting Time For Goal Achievement: 05/23/19 Potential to Achieve Goals: Fair    Frequency Min 1X/week   Barriers to discharge        Co-evaluation               AM-PAC PT "6 Clicks" Mobility  Outcome Measure Help needed turning from your back to your side while in a flat bed without using bedrails?: Total Help needed moving from lying on your back to sitting on the side of a flat bed without using bedrails?: Total Help needed moving to and from a bed to a chair (including a wheelchair)?: Total Help needed standing up from a chair using your arms ([name].g., wheelchair or bedside chair)?: Total Help needed to walk in hospital room?: Total Help needed climbing 3-5 steps with a railing? : Total 6 Click Score: 6    End of Session Equipment Utilized During Treatment: Oxygen Activity Tolerance: Patient limited by pain;Other (comment)(limited by cognition) Patient left: in bed;with call bell/phone within reach;with nursing/sitter in room   PT Visit Diagnosis: Other abnormalities of gait and mobility (R26.89);History of falling (Z91.81)    Time: 8756-4332 PT Time Calculation (min) (ACUTE ONLY): 16 min   Charges:   PT Evaluation $PT Eval Low Complexity: 1 Low         Kati PT, DPT Acute Rehabilitation Services Office: [phone]  Tiffany [name],Tiffany [name] 05/09/2019, 1:16 PM

## 2019-05-09 NOTE — Progress Notes (Signed)
Blood transfusion administered. Pre-transfusion temp difficult to obtain. Oral temp of 100.3. 15 min of blood transfusing - temp 100.5 rectally. Will administer acetaminophen suppository per PRNs. Dr. Margo Aye notified of events.

## 2019-05-09 NOTE — Progress Notes (Signed)
OT Cancellation Note  Patient Details Name: Tiffany [name] MRN: [id number] DOB: 11/29/1923   Cancelled Treatment:    Reason Eval/Treat Not Completed: Patient at procedure or test/ unavailable. Will attempt OT eval on different date and time.   Nygeria Lager 05/09/2019, 1:14 PM

## 2019-05-09 NOTE — Progress Notes (Signed)
Pharmacy Antibiotic Note  Tiffany [name] is a 86 y.o. female admitted on 05/06/2019 with 3/17 after a mechanical fall which resulted in a left hip fracture.  Post operatively had hypotension requiring initiation of pressors.  Pt now with worsening infiltrate, fever.  Pharmacy has been consulted for cefepime and vancomycin dosing for sepsis.  Plan: Cefepime 2gm IV q24h Vancomycin 1250mg  IV x 1 then continue 1250mg  q48h (AUC 515.8, Scr 1.08) Follow renal function, cultures and clinical course  Height: 5\' 4"  (162.6 cm) Weight: 133 lb 13.1 oz (60.7 kg) IBW/kg (Calculated) : 54.7  Temp (24hrs), Avg:99.3 F (37.4 C), Min:96.6 F (35.9 C), Max:100.5 F (38.1 C)  Recent Labs  Lab 05/06/19 1411 05/07/19 0530 05/07/19 2226 05/08/19 0140 05/09/19 0521  WBC 8.1 7.4 14.9* 11.4* 11.9*  CREATININE 0.68 0.69 0.77 0.79 1.08*    Estimated Creatinine Clearance: 28.1 mL/min (A) (by C-G formula based on SCr of 1.08 mg/dL (H)).    Allergies  Allergen Reactions  . Strawberry Flavor Hives and Rash    Strawberries     Thank you for allowing pharmacy to be a part of this patient's care.  05/09/19 RPh 05/09/2019, 1:19 PM

## 2019-05-09 NOTE — Progress Notes (Signed)
     Tiffany [name] is a 86 y.o. female   Orthopaedic diagnosis: Left intertrochanteric hip fracture status post fixation  Subjective: Patient is resting comfortably.  Per nursing staff she is awaiting blood products for low hemoglobin.  She has received some albumin.  Objectyive: Vitals:   05/09/19 0700 05/09/19 0745  BP: (!) 106/93 120/62  Pulse: 79 79  Resp:    Temp:    SpO2: (!) 86% 90%     Exam: Respirations even and unlabored No acute distress  Left hip dressing with slight shadowing.  Not increased since yesterday.    Assessment: Postop day 2 status post open treatment of left intertrochanteric hip fracture, doing well with some acute blood loss anemia from surgery.   Plan: Blood products have been ordered.  Her hemoglobin is 7 this morning. She is touchdown weightbearing on the left lower extremity if she is to get up out of bed Patient is still in the ICU with medical management per hospitalist.   Nicki [place], MD

## 2019-05-09 NOTE — Progress Notes (Signed)
O2 saturation was unable to read through majority of the start of the shift despite several probe placements. Occasionally picked up on the monitor reading 96-99%. O2 reading at 02:30 was saying 87%, so patient placed on 2L [place].

## 2019-05-09 NOTE — Progress Notes (Signed)
NAME:  Tiffany [name], MRN:  [id number], DOB:  1924/02/15, LOS: 3 ADMISSION DATE:  05/06/2019, CONSULTATION DATE:  3/18 REFERRING MD:  [place]ada Crane, CHIEF COMPLAINT:  Hypotension   Brief History   86 y/o female admitted on 3/17 after a mechanical fall which resulted in a left hip fracture.  Post operatively had hypotension requiring initiation of pressors.  Past Medical History  Dementia anemia  Significant Hospital Events   3/17 admission  Consults:  orthopedics  Procedures:  ORIF Left femur hip 3/18   Significant Diagnostic Tests:  3/18 TTE > LVEF 20-25%, left ventricle with severe decreased function, global hypokinesis  Micro Data:  COVID 3/17 > negative  Antimicrobials:  Ancef > 3/18 perioperative vanc 3/20 >  Cefepime 3/20 >    Interim history/subjective:  Remains on vasopressors Confused, will open eyes but very disoriented  Objective   Blood pressure (!) 104/55, pulse 73, temperature (!) 100.5 F (38.1 C), temperature source Rectal, resp. rate 18, height 5\' 4"  (1.626 m), weight 60.7 kg, SpO2 100 %.        Intake/Output Summary (Last 24 hours) at 05/09/2019 1223 Last data filed at 05/09/2019 1120 Gross per 24 hour  Intake 1443.72 ml  Output 0 ml  Net 1443.72 ml   Filed Weights   05/07/19 1427 05/08/19 0444 05/09/19 0500  Weight: 61.2 kg 58.1 kg 60.7 kg    Examination:  General:  Resting in bed HENT: NCAT OP clear PULM: CTA B, normal effort CV: RRR, no mgr GI: BS+, soft, nontender MSK: wounds well dressed, dressing C/D/I, no oozing or bruising Neuro: awake, alert, no distress, MAEW   Resolved Hospital Problem list     Assessment & Plan:  Post op Hypotension Now with worsening infiltrate, fever, worrisome for HCAP/Septic shock Wean off levophed for MAP > 65 Start hydrocortisone Start vanc/cefepime  Systolic congestive heart failure LVEF 20-25% Wean off levophed Monitor UOP Tele  Atrial fib Amiodarone infusion to continue Tele Consider  switch to oral amio tomorrow if taking by mouth  Left femur hip fracture Per ortho  Hemorrhagic anemia Monitor for bleeding Transfuse PRBC for Hgb < 7 gm/dL  Mild AKI Stop toradol Monitor BMET and UOP Replace electrolytes as needed Place foley catheter  Prognosis poor, I called her daughter to talk about the fact that her situation is getting worse and I don't believe she will be able to survive this.  We plan continued medical support with antibiotics (starting today) and vasopressors for another 24 hours, but if not better or if worse on 3/21 then change to comfort measures.   Best practice:  Diet: advance as tolerated Pain/Anxiety/Delirium protocol (if indicated): n/a VAP protocol (if indicated): n/a DVT prophylaxis: scd GI prophylaxis: n/a Glucose control: per TRH Mobility: bed rest Code Status: DNR Family Communication: updated her daughter [name] at length today Disposition: remain in ICU  Labs   CBC: Recent Labs  Lab 05/06/19 1411 05/06/19 1411 05/07/19 0530 05/07/19 2226 05/08/19 0140 05/08/19 0828 05/09/19 0521  WBC 8.1  --  7.4 14.9* 11.4*  --  11.9*  NEUTROABS 5.0  --   --   --   --   --   --   HGB 10.0*   < > 9.0* 9.8* 9.3* 9.0* 7.0*  HCT 31.7*   < > 27.7* 31.3* 29.1* 28.3* 21.6*  MCV 103.3*  --  102.6* 102.0* 100.3*  --  100.9*  PLT 128*  --  124* 109* 92*  --  120*   < > =  values in this interval not displayed.    Basic Metabolic Panel: Recent Labs  Lab 05/06/19 1411 05/07/19 0530 05/07/19 2226 05/08/19 0140 05/09/19 0521  NA 139 139 141 141 138  K 3.9 3.8 4.4 4.6 3.7  CL 105 104 104 105 101  CO2 27 28 25 30 27   GLUCOSE 111* 101* 169* 127* 129*  BUN 32* 27* 24* 25* 31*  CREATININE 0.68 0.69 0.77 0.79 1.08*  CALCIUM 8.9 8.6* 8.4* 8.5* 8.6*  MG  --   --   --  1.5* 1.7  PHOS  --   --   --  3.3  --    GFR: Estimated Creatinine Clearance: 28.1 mL/min (A) (by C-G formula based on SCr of 1.08 mg/dL (H)). Recent Labs  Lab 05/07/19 0530  05/07/19 2226 05/08/19 0140 05/09/19 0521  WBC 7.4 14.9* 11.4* 11.9*    Liver Function Tests: No results for input(s): AST, ALT, ALKPHOS, BILITOT, PROT, ALBUMIN in the last 168 hours. No results for input(s): LIPASE, AMYLASE in the last 168 hours. No results for input(s): AMMONIA in the last 168 hours.  ABG No results found for: PHART, PCO2ART, PO2ART, HCO3, TCO2, ACIDBASEDEF, O2SAT   Coagulation Profile: Recent Labs  Lab 05/06/19 1411  INR 1.2    Cardiac Enzymes: No results for input(s): CKTOTAL, CKMB, CKMBINDEX, TROPONINI in the last 168 hours.  HbA1C: No results found for: HGBA1C  CBG: No results for input(s): GLUCAP in the last 168 hours.    Critical care time: 35 minutes     05/08/19, MD [facility] Pager: [phone] Cell: [phone] If no response, call [phone]

## 2019-05-09 NOTE — Consult Note (Signed)
Consultation Note Date: 05/09/2019   Patient Name: Tiffany [name]  DOB: [birth year]  MRN: [id number]  Age / Sex: 86 y.o., female  PCP: System, Pcp Not In Referring Physician: Juanito Doom, MD  Reason for Consultation: Establishing goals of care  HPI/Patient Profile: 86 y.o. female admitted on 05/06/2019  .   Clinical Assessment and Goals of Care: 86 yo lady who lives at home with her daughter. She has history of dementia, anemia. She arrived with mechanical fall, L hip pain. She was admitted for L hip fracture for which she was seen by orthopedics and underwent repair. Pertinent hospital course issues also include new onset A fib, ECHO with EF 20-25%, acute blood loss anemia post surgery and hypotension requiring pressors, now with concerns for health care associated PNA, some component of sepsis. Now on PCCM service, on antibiotics, pressors, hydrocortisone.   A palliative consult has been requested for ongoing goals of care discussions.   [name] is resting in bed, I have discussed with bedside RN, patient required some morphine earlier in the day,she is currently resting in bed, doesn't arouse much, doesn't appear to have non verbal gestures of distress or discomfort. Her daughter Tiffany [name] is at the bedside.   I met with Tiffany [name] outside the patient's room, in the consult room. I introduced myself and palliative care as follows: Palliative medicine is specialized medical care for people living with serious illness. It focuses on providing relief from the symptoms and stress of a serious illness. The goal is to improve quality of life for both the patient and the family.  Goals of care: Broad aims of medical therapy in relation to the patient's values and preferences. Our aim is to provide medical care aimed at enabling patients to achieve the goals that matter most to them, given the circumstances of their  particular medical situation and their constraints.   Brief life review performed, we reviewed current hospitalization. Goals, wishes and values attempted to be explored. Discussed with daughter in detail about next steps, offered active listening and supportive care. Tiffany [name] is thankful for information provided. Natural decline trajectory from dementia standpoint discussed with daughter. Additionally, patient is s/p hip repair with post op complications as listed above.   See below.   HCPOA  patient lives at home with daughter Tiffany [name].  Patient has 4 children, her 3 sons also live locally.  Patient was not very ambulatory at baseline, her PO intake was reasonable.   SUMMARY OF RECOMMENDATIONS    Agree with DNR Discussed with daughter Tiffany [name] at bedside about patient's current condition, goals of care discussions held. Time limited trial of current interventions of pressors, hydrocortisone, antibiotics in hopes of stabilization is ongoing. If meaningful recovery doesn't appear possible, then would recommend comfort measures and hospice care. I have given daughter Tiffany [name] some initial information about what comfort measures/hospice care entails.  Thank you for the consult.   Code Status/Advance Care Planning:  DNR    Symptom Management:    as above   Palliative  Prophylaxis:   Delirium Protocol  Psycho-social/Spiritual:   Desire for further Chaplaincy support:yes  Additional Recommendations: Caregiving  Support/Resources  Prognosis:   Unable to determine  Discharge Planning: To Be Determined      Primary Diagnoses: Present on Admission: **None**   I have reviewed the medical record, interviewed the patient and family, and examined the patient. The following aspects are pertinent.  Past Medical History:  Diagnosis Date  . Anemia    Social History   Socioeconomic History  . Marital status: Single    Spouse name: Not on file  . Number of children: Not on file  .  Years of education: Not on file  . Highest education level: Not on file  Occupational History  . Not on file  Tobacco Use  . Smoking status: Never Smoker  . Smokeless tobacco: Never Used  Substance and Sexual Activity  . Alcohol use: No  . Drug use: No  . Sexual activity: Not on file  Other Topics Concern  . Not on file  Social History Narrative  . Not on file   Social Determinants of Health   Financial Resource Strain:   . Difficulty of Paying Living Expenses:   Food Insecurity:   . Worried About Charity fundraiser in the Last Year:   . Arboriculturist in the Last Year:   Transportation Needs:   . Film/video editor (Medical):   [name] Lack of Transportation (Non-Medical):   Physical Activity:   . Days of Exercise per Week:   . Minutes of Exercise per Session:   Stress:   . Feeling of Stress :   Social Connections:   . Frequency of Communication with Friends and Family:   . Frequency of Social Gatherings with Friends and Family:   . Attends Religious Services:   . Active Member of Clubs or Organizations:   . Attends Archivist Meetings:   [name] Marital Status:    Family History  Problem Relation Age of Onset  . Hypertension Other    Scheduled Meds: . Chlorhexidine Gluconate Cloth  6 each Topical Daily  . docusate sodium  100 mg Oral BID  . enoxaparin (LOVENOX) injection  30 mg Subcutaneous Q24H  . hydrocortisone sodium succinate  50 mg Intravenous Q6H  . influenza vaccine adjuvanted  0.5 mL Intramuscular Tomorrow-1000  . mouth rinse  15 mL Mouth Rinse BID  . mupirocin ointment  1 application Nasal BID   Continuous Infusions: . sodium chloride Stopped (05/08/19 0803)  . sodium chloride    . amiodarone 30 mg/hr (05/09/19 1100)  . ceFEPime (MAXIPIME) IV Stopped (05/09/19 1402)  . norepinephrine (LEVOPHED) Adult infusion 5 mcg/min (05/09/19 1100)  . vancomycin 1,250 mg (05/09/19 1415)  . [START ON 05/11/2019] vancomycin     PRN Meds:.sodium chloride,  HYDROcodone-acetaminophen, metoCLOPramide **OR** metoCLOPramide (REGLAN) injection, morphine injection, ondansetron **OR** ondansetron (ZOFRAN) IV Medications Prior to Admission:  Prior to Admission medications   Medication Sig Start Date End Date Taking? Authorizing Provider  diazepam (VALIUM) 5 MG tablet Take 5 mg by mouth once.   Yes [provider]  HYDROcodone-acetaminophen (NORCO/VICODIN) 5-325 MG tablet Take 1 tablet by mouth once.   Yes [provider]   Allergies  Allergen Reactions  . Strawberry Flavor Hives and Rash    Strawberries   Review of Systems Non verbal currently.   Physical Exam Frail elderly lady Resting in bed Shallow regular breath sounds Monitor noted Recent hip surgery Abdomen not distended  Vital Signs: BP (!) 104/55   Pulse 73   Temp (!) 100.5 F (38.1 C) (Rectal)   Resp 18   Ht '5\' 4"'  (1.626 m)   Wt 60.7 kg   SpO2 100%   BMI 22.97 kg/m  Pain Scale: PAINAD POSS *See Group Information*: 1-Acceptable,Awake and alert Pain Score: Asleep   SpO2: SpO2: 100 % O2 Device:SpO2: 100 % O2 Flow Rate: .O2 Flow Rate (L/min): 2 L/min  IO: Intake/output summary:   Intake/Output Summary (Last 24 hours) at 05/09/2019 1436 Last data filed at 05/09/2019 1120 Gross per 24 hour  Intake 1443.72 ml  Output 0 ml  Net 1443.72 ml    LBM: Last BM Date: (PTA) Baseline Weight: Weight: 61.2 kg Most recent weight: Weight: 60.7 kg     Palliative Assessment/Data:   PPS 30%  Time In:  1330 Time Out:  1430 Time Total:  60  Greater than 50%  of this time was spent counseling and coordinating care related to the above assessment and plan.  Signed by: Loistine Chance, MD   Please contact Palliative Medicine Team phone at [phone] for questions and concerns.  For individual provider: See Shea Evans

## 2019-05-09 NOTE — Progress Notes (Signed)
Progress Note  Patient Name: Tiffany [name] Date of Encounter: 05/09/2019  Primary Cardiologist: Kirk Ruths, MD   Subjective   Seems fairly comfortable.  Laying on side in bed, sleeping.  Pale.  Inpatient Medications    Scheduled Meds: . sodium chloride   Intravenous Once  . Chlorhexidine Gluconate Cloth  6 each Topical Daily  . docusate sodium  100 mg Oral BID  . enoxaparin (LOVENOX) injection  30 mg Subcutaneous Q24H  . influenza vaccine adjuvanted  0.5 mL Intramuscular Tomorrow-1000  . mouth rinse  15 mL Mouth Rinse BID  . mupirocin ointment  1 application Nasal BID   Continuous Infusions: . sodium chloride Stopped (05/08/19 0803)  . sodium chloride    . amiodarone 30 mg/hr (05/09/19 0600)  . lactated ringers    . norepinephrine (LEVOPHED) Adult infusion 4 mcg/min (05/09/19 0600)   PRN Meds: sodium chloride, acetaminophen, HYDROcodone-acetaminophen, ketorolac, metoCLOPramide **OR** metoCLOPramide (REGLAN) injection, morphine injection, ondansetron **OR** ondansetron (ZOFRAN) IV   Vital Signs    Vitals:   05/09/19 0600 05/09/19 0630 05/09/19 0700 05/09/19 0745  BP: (!) 108/59 (!) 88/33 (!) 106/93 120/62  Pulse:   79 79  Resp:      Temp:      TempSrc:      SpO2:   (!) 86% 90%  Weight:      Height:        Intake/Output Summary (Last 24 hours) at 05/09/2019 0826 Last data filed at 05/09/2019 0630 Gross per 24 hour  Intake 1553.63 ml  Output 0 ml  Net 1553.63 ml   Last 3 Weights 05/09/2019 05/08/2019 05/07/2019  Weight (lbs) 133 lb 13.1 oz 128 lb 1.4 oz 134 lb 14.7 oz  Weight (kg) 60.7 kg 58.1 kg 61.2 kg      Telemetry    Atrial fibrillation improved rate control- Personally Reviewed  ECG    A. fib 84 no ST changes- Personally Reviewed  Physical Exam   GEN:  Thin in bed elderly, pale Neck: No JVD Cardiac:  Irregularly irregular normal rate, no murmurs, rubs, or gallops.  Respiratory: Clear to auscultation bilaterally. GI: Soft, nontender,  non-distended  MS: No edema; No deformity. Neuro:  Nonfocal, dementia noted Psych: Normal affect   Labs    High Sensitivity Troponin:  No results for input(s): TROPONINIHS in the last 720 hours.    Chemistry Recent Labs  Lab 05/07/19 2226 05/08/19 0140 05/09/19 0521  NA 141 141 138  K 4.4 4.6 3.7  CL 104 105 101  CO2 25 30 27   GLUCOSE 169* 127* 129*  BUN 24* 25* 31*  CREATININE 0.77 0.79 1.08*  CALCIUM 8.4* 8.5* 8.6*  GFRNONAA >60 >60 44*  GFRAA >60 >60 51*  ANIONGAP 12 6 10      Hematology Recent Labs  Lab 05/07/19 2226 05/07/19 2226 05/08/19 0140 05/08/19 0828 05/09/19 0521  WBC 14.9*  --  11.4*  --  11.9*  RBC 3.07*  --  2.90*  --  2.14*  HGB 9.8*   < > 9.3* 9.0* 7.0*  HCT 31.3*   < > 29.1* 28.3* 21.6*  MCV 102.0*  --  100.3*  --  100.9*  MCH 31.9  --  32.1  --  32.7  MCHC 31.3  --  32.0  --  32.4  RDW 14.5  --  14.5  --  14.3  PLT 109*  --  92*  --  120*   < > = values in this interval not displayed.  BNPNo results for input(s): BNP, PROBNP in the last 168 hours.   DDimer No results for input(s): DDIMER in the last 168 hours.   Radiology    DG CHEST PORT 1 VIEW  Result Date: 05/08/2019 CLINICAL DATA:  History of dementia.  Fall EXAM: PORTABLE CHEST 1 VIEW COMPARISON:  Radiograph 05/07/2019 FINDINGS: Stable enlarged cardiac silhouette. Ectatic aorta. Small RIGHT effusion. LEFT lung clear. No pneumothorax. No fracture. IMPRESSION: Small RIGHT effusion.  Otherwise no acute cardiopulmonary findings. Electronically Signed   By: Genevive Bi M.D.   On: 05/08/2019 09:25   DG CHEST PORT 1 VIEW  Result Date: 05/07/2019 CLINICAL DATA:  Pulmonary edema and altered mental status EXAM: PORTABLE CHEST 1 VIEW COMPARISON:  05/06/2019 FINDINGS: Patient is extremely rotated to the right. The left lung is clear. The visible portion of the right lung is unremarkable. Mild cardiomegaly. IMPRESSION: Cardiomegaly without focal airspace disease. The patient is extremely  rotated to the right. Electronically Signed   By: Deatra Robinson M.D.   On: 05/07/2019 22:28   DG C-Arm 1-60 Min-No Report  Result Date: 05/07/2019 Fluoroscopy was utilized by the requesting physician.  No radiographic interpretation.   ECHOCARDIOGRAM COMPLETE  Result Date: 05/07/2019    ECHOCARDIOGRAM REPORT   Patient Name:   Tiffany [name] Date of Exam: 05/07/2019 Medical Rec #:  [id number]     Height:       64.0 in Accession #:    [id number]    Weight:       135.0 lb Date of Birth:  09/20/1923     BSA:          1.655 m Patient Age:    86 years      BP:           97/56 mmHg Patient Gender: F             HR:           111 bpm. Exam Location:  Inpatient Procedure: 2D Echo                            MODIFIED REPORT: This report was modified by Lennie Odor MD on 05/07/2019 due to error.  Indications:     Atrial Fibrillation 427.31 / I48.91  History:         Patient has no prior history of Echocardiogram examinations.                  Anemia. Left hip fracture. Dementia.  Sonographer:     Leta Jungling RDCS Referring Phys:  [id number] Calvert Cantor Diagnosing Phys: Lennie Odor MD  Sonographer Comments: Technically challenging study due to limited acoustic windows. IMPRESSIONS  1. Difficult to assess EF due to atrial fibrillation with RVR, but appears severely reduced 20-25%. Would recommend to control rates and re-check EF.  2. Left ventricular ejection fraction, by estimation, is 20 to 25%. The left ventricle has severely decreased function. The left ventricle demonstrates global hypokinesis. Left ventricular diastolic function could not be evaluated.  3. Right ventricular systolic function is mildly reduced. The right ventricular size is mildly enlarged. There is normal pulmonary artery systolic pressure.  4. Left atrial size was mild to moderately dilated.  5. Right atrial size was mildly dilated.  6. The mitral valve is degenerative. Mild to moderate mitral valve regurgitation. No evidence of mitral stenosis.  7.  Tricuspid valve regurgitation is mild to moderate.  8. The aortic valve  is tricuspid. Aortic valve regurgitation is mild. Mild to moderate aortic valve sclerosis/calcification is present, without any evidence of aortic stenosis.  9. The inferior vena cava is normal in size with <50% respiratory variability, suggesting right atrial pressure of 8 mmHg. FINDINGS  Left Ventricle: Left ventricular ejection fraction, by estimation, is 20 to 25%. The left ventricle has severely decreased function. The left ventricle demonstrates global hypokinesis. The left ventricular internal cavity size was normal in size. There is no left ventricular hypertrophy. Left ventricular diastolic function could not be evaluated. Right Ventricle: The right ventricular size is mildly enlarged. No increase in right ventricular wall thickness. Right ventricular systolic function is mildly reduced. There is normal pulmonary artery systolic pressure. The tricuspid regurgitant velocity  is 2.43 m/s, and with an assumed right atrial pressure of 8 mmHg, the estimated right ventricular systolic pressure is 31.6 mmHg. Left Atrium: Left atrial size was mild to moderately dilated. Right Atrium: Right atrial size was mildly dilated. Pericardium: There is no evidence of pericardial effusion. Mitral Valve: The mitral valve is degenerative in appearance. Mild to moderate mitral annular calcification. Mild to moderate mitral valve regurgitation. No evidence of mitral valve stenosis. Tricuspid Valve: The tricuspid valve is grossly normal. Tricuspid valve regurgitation is mild to moderate. No evidence of tricuspid stenosis. Aortic Valve: The aortic valve is tricuspid. . There is mild thickening and mild calcification of the aortic valve. Aortic valve regurgitation is mild. Aortic regurgitation PHT measures 370 msec. Mild to moderate aortic valve sclerosis/calcification is present, without any evidence of aortic stenosis. There is mild thickening of the aortic  valve. There is mild calcification of the aortic valve. Pulmonic Valve: The pulmonic valve was not assessed. Pulmonic valve regurgitation is not visualized. No evidence of pulmonic stenosis. Aorta: The aortic root is normal in size and structure. Venous: The inferior vena cava is normal in size with less than 50% respiratory variability, suggesting right atrial pressure of 8 mmHg. IAS/Shunts: No atrial level shunt detected by color flow Doppler. EKG: Rhythm strip during this exam demostrated atrial fibrillation.  LEFT VENTRICLE PLAX 2D LVOT diam:     1.90 cm  Diastology LV SV:         28       LV e' lateral:   15.90 cm/s LV SV Index:   17       LV E/e' lateral: 3.5 LVOT Area:     2.84 cm LV e' medial:    7.88 cm/s                         LV E/e' medial:  7.0  RIGHT VENTRICLE RV S prime:     10.30 cm/s TAPSE (M-mode): 1.6 cm LEFT ATRIUM             Index       RIGHT ATRIUM           Index LA Vol (A2C):   66.2 ml 39.99 ml/m RA Area:     23.00 cm LA Vol (A4C):   62.3 ml 37.63 ml/m RA Volume:   70.10 ml  42.35 ml/m LA Biplane Vol: 65.0 ml 39.27 ml/m  AORTIC VALVE LVOT Vmax:   63.30 cm/s LVOT Vmean:  46.400 cm/s LVOT VTI:    0.100 m AI PHT:      370 msec MITRAL VALVE                 TRICUSPID VALVE MV Area (PHT): 4.19  cm      TR Peak grad:   23.6 mmHg MV Decel Time: 181 msec      TR Vmax:        243.00 cm/s MR Peak grad:    58.1 mmHg MR Mean grad:    43.0 mmHg   SHUNTS MR Vmax:         381.00 cm/s Systemic VTI:  0.10 m MR Vmean:        316.0 cm/s  Systemic Diam: 1.90 cm MR PISA:         0.25 cm MR PISA Eff ROA: 3 mm MR PISA Radius:  0.20 cm MV E velocity: 55.25 cm/s Lennie Odor MD Electronically signed by Lennie Odor MD Signature Date/Time: 05/07/2019/2:06:43 PM    Final (Updated)    DG FEMUR MIN 2 VIEWS LEFT  Result Date: 05/07/2019 CLINICAL DATA:  86 year old female status post left femoral ORIF. EXAM: LEFT FEMUR 2 VIEWS COMPARISON:  Radiograph dated 05/06/2019. FINDINGS: Six fluoroscopic intraoperative  images provided. The total fluoroscopic time is 26 seconds. Left femoral intramedullary rod and transcervical screw noted. IMPRESSION: Status post left femoral ORIF. Electronically Signed   By: Elgie Collard M.D.   On: 05/07/2019 20:44    Cardiac Studies   Echocardiogram during this admission -EF 20 to 25% mild to moderate mitral regurgitation no aortic stenosis  Patient Profile     86 y.o. female with newly discovered atrial fibrillation in the setting of hip fracture dementia falls  Assessment & Plan    Paroxysmal atrial fibrillation new onset -Heart rate was elevated yesterday with hypotension.  Unable to utilize beta-blocker or calcium channel blocker.  IV amiodarone was started. -Continue to provide rate control.  Seems to be stabilizing. -Not a candidate for anticoagulation.  Agree with Dr. Jens Som.  Dementia, falls, fractured hip.  CHA2DS2-VASc of at least 4  Dementia -Per primary team.  Hip fracture -Orthopedics note reviewed.  Open reduction internal fixation with intramedullary nail performed yesterday.  Cardiomyopathy -Continue with conservative management at this point.  Not a candidate for aggressive measures.  It is quite possible that atrial fibrillation with rapid ventricular response is the reason, tachycardia mediated cardiomyopathy.  Shock -Could have a cardiogenic component given her reduced ejection fraction however could also be secondary to blood loss-2 units packed red blood cells administered.  Currently on low-dose norepinephrine 4 mcg for support.  Has received albumin.  Watch for any signs of hypoxia.  Blood loss anemia -Post PRBCs.  This morning however hemoglobin 7.  Blood transfusion ordered.  CODE STATUS-DNR  CRITICAL CARE Performed by: Donato Schultz   Total critical care time: 35 minutes  Critical care time was exclusive of separately billable procedures and treating other patients.  Critical care was necessary to treat or prevent  imminent or life-threatening deterioration.  Critical care was time spent personally by me on the following activities: development of treatment plan with patient and/or surrogate as well as nursing, discussions with consultants, evaluation of patient's response to treatment, examination of patient, obtaining history from patient or surrogate, ordering and performing treatments and interventions, ordering and review of laboratory studies, ordering and review of radiographic studies, pulse oximetry and re-evaluation of patient's condition.      For questions or updates, please contact [facility] Please consult [email or web address] for contact info under        Signed, Donato Schultz, MD  05/09/2019, 8:26 AM

## 2019-05-09 NOTE — Progress Notes (Addendum)
PROGRESS NOTE  Tiffany [name] IYM:[id number] DOB: 11-14-1923 DOA: 05/06/2019 PCP: System, Pcp Not In  HPI/Recap of past 24 hours: Tiffany [name] is a 86 y.o. female with medical history of anemia who fell at home on 3/16 and presented for pain in her left hip on 3/17.   Orthopedic surgery was consulted.  On admission she had new onset A. fib with controlled rate.  2D echo was ordered to further assess and it revealed LVEF 20 to 25%, newly diagnosed.  Had left hip repair on 05/07/2019 by Dr. Susa Simmonds.  700cc blood loss, received 2U PRBCs.  Transferred to ICU due to hypotension requiring pressors.  Initially was on Neo which was switched to levophed on 3/19.  Hospital course complicated by persistent hypotension requiring pressors, acute blood loss anemia, confusion, new hypoxia, and AKI.  05/09/19: Seen and examined.  She is alert and confused.  Pulling at her nasal cannula.  Hemoglobin dropped this morning down to 7.0.  1 unit PRBC ordered to be transfused.  MAP of 57 on Levophed, ordered 250 cc of LR bolus.  Called her daughter to give update, no answer.  Assessment/Plan: Active Problems:   S/p left hip fracture   New onset a-fib (HCC)  Left hip fracture post fall, POD #2 post repair on 3/18 Dr. Susa Simmonds. Received 2 unit PRBCs on May 07, 2019 after a 700 cc postsurgical blood loss Followed by orthopedic surgery. Pain management in place  Severe hypotension requiring pressors suspect multifactorial 2/2 to acute blood loss versus acute systolic heart failure Continue to maintain MAP greater than 65 Continue pressors as needed to maintain MAP greater than 65 Has received LR boluses judiciously due to acute systolic CHF 20-25%  Postop fever Rule out active infective process in the setting of persistent severe hypotension Obtain UA, urine culture, blood culture, procalcitonin and lactic acid. Continue to monitor fever curve and WBC Repeat CBC with differential in the morning  Acute hypoxic  respiratory failure suspect secondary to mild pulmonary edema and small right pleural effusion Not on oxygen supplementation at baseline Personally reviewed chest x-ray done on May 08, 2019 which showed mild increase in pulmonary vascularity suggestive of pulmonary edema and small right pleural effusion. Incentive spirometer as tolerated Maintain O2 saturation greater than 92%  Newly diagnosed acute systolic CHF/cardiomyopathy Etiology is unclear, cardiology is following. 2D echo shows LVEF 20-25% Blood pressure continues to be soft.  Beta-blocker and ARB on hold due to hypotension. Net I&O +3.5 L Continue to monitor volume status and respiratory status.  Newly diagnosed A. Fib, rate controlled Currently on amiodarone drip per cardiology CHADSVASc 3, not a candidate for oral anticoagulation due to recurrent falls Continue to closely monitor  Acute blood loss anemia post surgery Baseline Hg 10-11 700 cc blood loss Received 2U PRBCs Hemoglobin dropped down to 7.0 this morning We will transfuse another unit PRBC Repeat H&H posttransfusion Continue to monitor H&H  Dysphagia Seen by speech therapist with recommendation for dysphagia 3 diet mechanical soft and thin liquids Continue aspiration precautions Start feeding assistance due to confusion  Resolved post repletion hypomagnesemia Mg2+ 1.5>> 1.7  Physical debility/ambulatory function PT OT to assess when stable. Fall precautions.  Dementia Minimize distractors Reorient Fall precautions   Code Status:  DNR  Family Communication: Called her daughter to give updates.  No answer.  Will call again.  Disposition Plan: Patient is from home.  Anticipate discharge to SNF once she is hemodynamically stable no longer requiring pressors and orthopedic  surgery signs off.  Barrier to discharge hypotension requiring pressors.  Assessment pending for PT OT.   Consultants:  Orthopedic surgeon  Cardiology  Procedures:  Left  hip repair on 05/07/2019.  Antimicrobials:  None  DVT prophylaxis: SCDs.   Objective: Vitals:   05/09/19 0831 05/09/19 0835 05/09/19 0845 05/09/19 0900  BP:  (!) 116/52 (!) 118/52 102/63  Pulse:  84 74 84  Resp:  18    Temp: (!) 96.6 F (35.9 C) 100.3 F (37.9 C)  (!) 100.5 F (38.1 C)  TempSrc: Axillary Oral  Rectal  SpO2:  100% 100% 100%  Weight:      Height:        Intake/Output Summary (Last 24 hours) at 05/09/2019 0944 Last data filed at 05/09/2019 0630 Gross per 24 hour  Intake 933.59 ml  Output 0 ml  Net 933.59 ml   Filed Weights   05/07/19 1427 05/08/19 0444 05/09/19 0500  Weight: 61.2 kg 58.1 kg 60.7 kg    Exam:  . General: 86 y.o. year-old female very frail-appearing in no acute distress.  Confused and pulling at her nasal cannula. . Cardiovascular: Irregular rate and rhythm no rubs or gallops.   [name] Respiratory: Mild rales at bases no wheezing noted.   . Abdomen: Soft nontender normal bowel sounds present. . Musculoskeletal: Bilateral lower extremity edema.   [name] Psychiatry: Confused.   Data Reviewed: CBC: Recent Labs  Lab 05/06/19 1411 05/06/19 1411 05/07/19 0530 05/07/19 2226 05/08/19 0140 05/08/19 0828 05/09/19 0521  WBC 8.1  --  7.4 14.9* 11.4*  --  11.9*  NEUTROABS 5.0  --   --   --   --   --   --   HGB 10.0*   < > 9.0* 9.8* 9.3* 9.0* 7.0*  HCT 31.7*   < > 27.7* 31.3* 29.1* 28.3* 21.6*  MCV 103.3*  --  102.6* 102.0* 100.3*  --  100.9*  PLT 128*  --  124* 109* 92*  --  120*   < > = values in this interval not displayed.   Basic Metabolic Panel: Recent Labs  Lab 05/06/19 1411 05/07/19 0530 05/07/19 2226 05/08/19 0140 05/09/19 0521  NA 139 139 141 141 138  K 3.9 3.8 4.4 4.6 3.7  CL 105 104 104 105 101  CO2 27 28 25 30 27   GLUCOSE 111* 101* 169* 127* 129*  BUN 32* 27* 24* 25* 31*  CREATININE 0.68 0.69 0.77 0.79 1.08*  CALCIUM 8.9 8.6* 8.4* 8.5* 8.6*  MG  --   --   --  1.5* 1.7  PHOS  --   --   --  3.3  --    GFR: Estimated  Creatinine Clearance: 28.1 mL/min (A) (by C-G formula based on SCr of 1.08 mg/dL (H)). Liver Function Tests: No results for input(s): AST, ALT, ALKPHOS, BILITOT, PROT, ALBUMIN in the last 168 hours. No results for input(s): LIPASE, AMYLASE in the last 168 hours. No results for input(s): AMMONIA in the last 168 hours. Coagulation Profile: Recent Labs  Lab 05/06/19 1411  INR 1.2   Cardiac Enzymes: No results for input(s): CKTOTAL, CKMB, CKMBINDEX, TROPONINI in the last 168 hours. BNP (last 3 results) No results for input(s): PROBNP in the last 8760 hours. HbA1C: No results for input(s): HGBA1C in the last 72 hours. CBG: No results for input(s): GLUCAP in the last 168 hours. Lipid Profile: No results for input(s): CHOL, HDL, LDLCALC, TRIG, CHOLHDL, LDLDIRECT in the last 72 hours. Thyroid Function Tests:  Recent Labs    05/07/19 0530  TSH 1.361  FREET4 0.94   Anemia Panel: No results for input(s): VITAMINB12, FOLATE, FERRITIN, TIBC, IRON, RETICCTPCT in the last 72 hours. Urine analysis:    Component Value Date/Time   COLORURINE YELLOW 12/11/2016 2347   APPEARANCEUR CLEAR 12/11/2016 2347   LABSPEC 1.010 12/11/2016 2347   PHURINE 6.0 12/11/2016 2347   GLUCOSEU NEGATIVE 12/11/2016 2347   HGBUR SMALL (A) 12/11/2016 2347   BILIRUBINUR NEGATIVE 12/11/2016 2347   KETONESUR NEGATIVE 12/11/2016 2347   PROTEINUR NEGATIVE 12/11/2016 2347   NITRITE NEGATIVE 12/11/2016 2347   LEUKOCYTESUR NEGATIVE 12/11/2016 2347   Sepsis Labs: @LABRCNTIP (procalcitonin:4,lacticidven:4)  ) Recent Results (from the past 240 hour(s))  Respiratory Panel by RT PCR (Flu A&B, Covid) - Nasopharyngeal Swab     Status: None   Collection Time: 05/06/19  2:50 PM   Specimen: Nasopharyngeal Swab  Result Value Ref Range Status   SARS Coronavirus 2 by RT PCR NEGATIVE NEGATIVE Final    Comment: (NOTE) SARS-CoV-2 target nucleic acids are NOT DETECTED. The SARS-CoV-2 RNA is generally detectable in upper  respiratoy specimens during the acute phase of infection. The lowest concentration of SARS-CoV-2 viral copies this assay can detect is 131 copies/mL. A negative result does not preclude SARS-Cov-2 infection and should not be used as the sole basis for treatment or other patient management decisions. A negative result may occur with  improper specimen collection/handling, submission of specimen other than nasopharyngeal swab, presence of viral mutation(s) within the areas targeted by this assay, and inadequate number of viral copies (<131 copies/mL). A negative result must be combined with clinical observations, patient history, and epidemiological information. The expected result is Negative. Fact Sheet for Patients:  05/08/19 Fact Sheet for Healthcare Providers:  https://www.moore.com/ This test is not yet ap proved or cleared by the https://www.young.biz/ FDA and  has been authorized for detection and/or diagnosis of SARS-CoV-2 by FDA under an Emergency Use Authorization (EUA). This EUA will remain  in effect (meaning this test can be used) for the duration of the COVID-19 declaration under Section 564(b)(1) of the Act, 21 U.S.C. section 360bbb-3(b)(1), unless the authorization is terminated or revoked sooner.    Influenza A by PCR NEGATIVE NEGATIVE Final   Influenza B by PCR NEGATIVE NEGATIVE Final    Comment: (NOTE) The Xpert Xpress SARS-CoV-2/FLU/RSV assay is intended as an aid in  the diagnosis of influenza from Nasopharyngeal swab specimens and  should not be used as a sole basis for treatment. Nasal washings and  aspirates are unacceptable for Xpert Xpress SARS-CoV-2/FLU/RSV  testing. Fact Sheet for Patients: [place] Fact Sheet for Healthcare Providers: https://www.moore.com/ This test is not yet approved or cleared by the https://www.young.biz/ FDA and  has been authorized for  detection and/or diagnosis of SARS-CoV-2 by  FDA under an Emergency Use Authorization (EUA). This EUA will remain  in effect (meaning this test can be used) for the duration of the  Covid-19 declaration under Section 564(b)(1) of the Act, 21  U.S.C. section 360bbb-3(b)(1), unless the authorization is  terminated or revoked. Performed at [facility], [address], [place], [place]   Surgical pcr screen     Status: Abnormal   Collection Time: 05/07/19  4:55 AM   Specimen: Nasal Mucosa; Nasal Swab  Result Value Ref Range Status   MRSA, PCR NEGATIVE NEGATIVE Final   Staphylococcus aureus POSITIVE (A) NEGATIVE Final    Comment: (NOTE) The Xpert SA Assay (FDA approved  for NASAL specimens in patients 74 years of age and older), is one component of a comprehensive surveillance program. It is not intended to diagnose infection nor to guide or monitor treatment. Performed at [facility], [address], [place], [place] [address]       Studies: No results found.  Scheduled Meds: . Chlorhexidine Gluconate Cloth  6 each Topical Daily  . docusate sodium  100 mg Oral BID  . enoxaparin (LOVENOX) injection  30 mg Subcutaneous Q24H  . influenza vaccine adjuvanted  0.5 mL Intramuscular Tomorrow-1000  . mouth rinse  15 mL Mouth Rinse BID  . mupirocin ointment  1 application Nasal BID    Continuous Infusions: . sodium chloride Stopped (05/08/19 0803)  . sodium chloride    . amiodarone 30 mg/hr (05/09/19 0600)  . lactated ringers    . norepinephrine (LEVOPHED) Adult infusion 4 mcg/min (05/09/19 0600)     LOS: 3 days     Darlin Drop, MD [facility] Pager [phone]  If 7PM-7AM, please contact night-coverage [email or web address] Password [facility] 05/09/2019, 9:44 AM

## 2019-05-10 DIAGNOSIS — A419 Sepsis, unspecified organism: Secondary | ICD-10-CM

## 2019-05-10 DIAGNOSIS — L8990 Pressure ulcer of unspecified site, unspecified stage: Secondary | ICD-10-CM | POA: Insufficient documentation

## 2019-05-10 DIAGNOSIS — R6521 Severe sepsis with septic shock: Secondary | ICD-10-CM

## 2019-05-10 LAB — COMPREHENSIVE METABOLIC PANEL
ALT: 13 U/L (ref 0–44)
AST: 40 U/L (ref 15–41)
Albumin: 3.3 g/dL — ABNORMAL LOW (ref 3.5–5.0)
Alkaline Phosphatase: 65 U/L (ref 38–126)
Anion gap: 11 (ref 5–15)
BUN: 28 mg/dL — ABNORMAL HIGH (ref 8–23)
CO2: 22 mmol/L (ref 22–32)
Calcium: 8.4 mg/dL — ABNORMAL LOW (ref 8.9–10.3)
Chloride: 102 mmol/L (ref 98–111)
Creatinine, Ser: 0.86 mg/dL (ref 0.44–1.00)
GFR calc Af Amer: >60 mL/min (ref >60)
GFR calc non Af Amer: 58 mL/min — ABNORMAL LOW (ref >60)
Glucose, Bld: 203 mg/dL — ABNORMAL HIGH (ref 70–99)
Potassium: 3.3 mmol/L — ABNORMAL LOW (ref 3.5–5.1)
Sodium: 135 mmol/L (ref 135–145)
Total Bilirubin: 1.2 mg/dL (ref 0.3–1.2)
Total Protein: 5.8 g/dL — ABNORMAL LOW (ref 6.5–8.1)

## 2019-05-10 LAB — TYPE AND SCREEN
ABO/RH(D): A NEG
Antibody Screen: NEGATIVE
Unit division: 0
Unit division: 0
Unit division: 0

## 2019-05-10 LAB — CBC WITH DIFFERENTIAL/PLATELET
Abs Immature Granulocytes: 0.07 10*3/uL (ref 0.00–0.07)
Basophils Absolute: 0 10*3/uL (ref 0.0–0.1)
Basophils Relative: 0 %
Eosinophils Absolute: 0 10*3/uL (ref 0.0–0.5)
Eosinophils Relative: 0 %
HCT: 27.1 % — ABNORMAL LOW (ref 36.0–46.0)
Hemoglobin: 9 g/dL — ABNORMAL LOW (ref 12.0–15.0)
Immature Granulocytes: 1 %
Lymphocytes Relative: 6 %
Lymphs Abs: 0.6 10*3/uL — ABNORMAL LOW (ref 0.7–4.0)
MCH: 31.1 pg (ref 26.0–34.0)
MCHC: 33.2 g/dL (ref 30.0–36.0)
MCV: 93.8 fL (ref 80.0–100.0)
Monocytes Absolute: 0.9 10*3/uL (ref 0.1–1.0)
Monocytes Relative: 9 %
Neutro Abs: 8.6 10*3/uL — ABNORMAL HIGH (ref 1.7–7.7)
Neutrophils Relative %: 84 %
Platelets: 129 10*3/uL — ABNORMAL LOW (ref 150–400)
RBC: 2.89 MIL/uL — ABNORMAL LOW (ref 3.87–5.11)
RDW: 17.1 % — ABNORMAL HIGH (ref 11.5–15.5)
WBC: 10.2 10*3/uL (ref 4.0–10.5)
nRBC: 0 % (ref 0.0–0.2)

## 2019-05-10 LAB — BPAM RBC
Blood Product Expiration Date: 202103302359
Blood Product Expiration Date: 202104192359
Blood Product Expiration Date: 202104212359
ISSUE DATE / TIME: 202103181642
ISSUE DATE / TIME: 202103181642
ISSUE DATE / TIME: 202103200839
Unit Type and Rh: 600
Unit Type and Rh: 600
Unit Type and Rh: 600

## 2019-05-10 LAB — URINE CULTURE
Culture: NO GROWTH
Special Requests: NORMAL

## 2019-05-10 MED ORDER — SODIUM CHLORIDE 0.9 % IV SOLN
2.0000 g | Freq: Two times a day (BID) | INTRAVENOUS | Status: DC
  Administered 2019-05-10 – 2019-05-14 (×9): 2 g via INTRAVENOUS
  Filled 2019-05-10 (×9): qty 2

## 2019-05-10 MED ORDER — ENOXAPARIN SODIUM 40 MG/0.4ML ~~LOC~~ SOLN
40.0000 mg | SUBCUTANEOUS | Status: DC
  Administered 2019-05-11 – 2019-05-13 (×3): 40 mg via SUBCUTANEOUS
  Filled 2019-05-10 (×3): qty 0.4

## 2019-05-10 MED ORDER — VANCOMYCIN HCL 1500 MG/300ML IV SOLN
1500.0000 mg | INTRAVENOUS | Status: DC
  Filled 2019-05-10: qty 300

## 2019-05-10 MED ORDER — POTASSIUM CHLORIDE 10 MEQ/100ML IV SOLN
10.0000 meq | INTRAVENOUS | Status: AC
  Administered 2019-05-10 (×2): 10 meq via INTRAVENOUS
  Filled 2019-05-10 (×2): qty 100

## 2019-05-10 NOTE — Progress Notes (Signed)
Progress Note  Patient Name: Tiffany [name] Date of Encounter: 05/10/2019  Primary Cardiologist: Olga Millers, MD   Subjective   Elderly, comfortable.  Smiling at times.  Conversant, son in room.  Palliative care consult note reviewed from yesterday.  Mild fever noted.  Still on norepinephrine.  Inpatient Medications    Scheduled Meds: . docusate sodium  100 mg Oral BID  . enoxaparin (LOVENOX) injection  40 mg Subcutaneous Q24H  . hydrocortisone sodium succinate  50 mg Intravenous Q6H  . influenza vaccine adjuvanted  0.5 mL Intramuscular Tomorrow-1000  . mouth rinse  15 mL Mouth Rinse BID  . mupirocin ointment  1 application Nasal BID   Continuous Infusions: . sodium chloride Stopped (05/08/19 0803)  . sodium chloride    . amiodarone 30 mg/hr (05/10/19 0820)  . ceFEPime (MAXIPIME) IV    . norepinephrine (LEVOPHED) Adult infusion 6 mcg/min (05/10/19 0820)  . [START ON 05/11/2019] vancomycin     PRN Meds: sodium chloride, HYDROcodone-acetaminophen, metoCLOPramide **OR** metoCLOPramide (REGLAN) injection, morphine injection, ondansetron **OR** ondansetron (ZOFRAN) IV   Vital Signs    Vitals:   05/10/19 0710 05/10/19 0800 05/10/19 0815 05/10/19 0828  BP:  (!) 83/54 (!) 80/49   Pulse: 75 87 84   Resp:      Temp:    99.8 F (37.7 C)  TempSrc:    Axillary  SpO2: 97% 96% 96%   Weight:      Height:        Intake/Output Summary (Last 24 hours) at 05/10/2019 4580 Last data filed at 05/10/2019 0820 Gross per 24 hour  Intake 2007.14 ml  Output 1000 ml  Net 1007.14 ml   Last 3 Weights 05/10/2019 05/09/2019 05/08/2019  Weight (lbs) 134 lb 7.7 oz 133 lb 13.1 oz 128 lb 1.4 oz  Weight (kg) 61 kg 60.7 kg 58.1 kg      Telemetry    Atrial fibrillation improved rate control, no changes from yesterday- Personally Reviewed  ECG    A. fib 84 no ST changes- Personally Reviewed  Physical Exam   GEN: Elderly in bed, in no acute distress  HEENT: normal  Neck: no JVD, carotid  bruits, or masses Cardiac: Irregularly irregular; no murmurs, rubs, or gallops,no edema  Respiratory:  clear to auscultation bilaterally, normal work of breathing GI: soft, nontender, nondistended, + BS MS: no deformity or atrophy  Skin: warm and dry, no rash Neuro: Moves all extremities psych: Dementia noted  Labs    High Sensitivity Troponin:  No results for input(s): TROPONINIHS in the last 720 hours.    Chemistry Recent Labs  Lab 05/08/19 0140 05/09/19 0521 05/10/19 0217  NA 141 138 135  K 4.6 3.7 3.3*  CL 105 101 102  CO2 30 27 22   GLUCOSE 127* 129* 203*  BUN 25* 31* 28*  CREATININE 0.79 1.08* 0.86  CALCIUM 8.5* 8.6* 8.4*  PROT  --   --  5.8*  ALBUMIN  --   --  3.3*  AST  --   --  40  ALT  --   --  13  ALKPHOS  --   --  65  BILITOT  --   --  1.2  GFRNONAA >60 44* 58*  GFRAA >60 51* >60  ANIONGAP 6 10 11      Hematology Recent Labs  Lab 05/09/19 0521 05/09/19 1330 05/10/19 0217  WBC 11.9* 10.4 10.2  RBC 2.14* 2.52* 2.89*  HGB 7.0* 7.9* 9.0*  HCT 21.6* 24.4* 27.1*  MCV 100.9* 96.8 93.8  MCH 32.7 31.3 31.1  MCHC 32.4 32.4 33.2  RDW 14.3 15.8* 17.1*  PLT 120* 131* 129*    BNPNo results for input(s): BNP, PROBNP in the last 168 hours.   DDimer No results for input(s): DDIMER in the last 168 hours.   Radiology    No results found.  Cardiac Studies   Echocardiogram during this admission -EF 20 to 25% mild to moderate mitral regurgitation no aortic stenosis  Patient Profile     86 y.o. female with newly discovered atrial fibrillation in the setting of hip fracture dementia falls  Assessment & Plan    Paroxysmal atrial fibrillation new onset -Heart rate was elevated yesterday with hypotension.  Unable to utilize beta-blocker or calcium channel blocker.  IV amiodarone was started.  Agree with changing to p.o. amiodarone when able.  Obviously if full comfort care becomes her goal, we can avoid amiodarone. -Continue to provide rate control.  Seems  to be stabilizing.  Norepinephrine of course may exacerbate. -Not a candidate for anticoagulation.  Agree with Dr. Stanford Breed.  Dementia, falls, fractured hip.  CHA2DS2-VASc of at least 4 -Try to maintain potassium greater than 4.  Dementia -Per primary team.  Palliative care team note reviewed.  Hip fracture -Orthopedics note reviewed.  Open reduction internal fixation with intramedullary nail performed.  Cardiomyopathy -Continue with conservative management at this point.  Not a candidate for aggressive measures.  It is quite possible that atrial fibrillation with rapid ventricular response is the reason, tachycardia mediated cardiomyopathy.  Regardless, she does not have enough blood pressure to utilize beta-blocker, ACE inhibitor or other typical heart failure type medications.  Does not appear to be fluid overloaded.  Shock -Could have a cardiogenic component given her reduced ejection fraction.  Worried about potential pneumonia as well.  Mild fever.  Currently on low-dose norepinephrine 6 mcg for support.  Has received albumin.  Watch for any signs of hypoxia.  Blood loss anemia -Post PRBCs.  Hemoglobin remaining stable at 9.  CODE STATUS-DNR Spoke to her son      For questions or updates, please contact [facility] Please consult [email or web address] for contact info under        Signed, Candee Furbish, MD  05/10/2019, 9:07 AM

## 2019-05-10 NOTE — Progress Notes (Signed)
[facility] ADULT ICU REPLACEMENT PROTOCOL FOR AM LAB REPLACEMENT ONLY  The patient does apply for the [facility] Adult ICU Electrolyte Replacment Protocol based on the criteria listed below:   1. Is GFR >/= 40 ml/min? Yes.    Patient's GFR today is >60 2. Is urine output >/= 0.5 ml/kg/hr for the last 6 hours? Yes.   Patient's UOP is 0.97 ml/kg/hr 3. Is BUN < 60 mg/dL? Yes.    Patient's BUN today is 28 4. Abnormal electrolyte K 3.3 5. Ordered repletion with: protocol 6. If a panic level lab has been reported, has the CCM MD in charge been notified? Yes.  .   Physician:  Marcene Corning 05/10/2019 4:34 AM

## 2019-05-10 NOTE — Progress Notes (Addendum)
NAME:  Tiffany [name], MRN:  [id number], DOB:  1923/03/10, LOS: 4 ADMISSION DATE:  05/06/2019, CONSULTATION DATE:  3/18 REFERRING MD:  [place] Crane, CHIEF COMPLAINT:  Hypotension   Brief History   86 y/o female admitted on 3/17 after a mechanical fall which resulted in a left hip fracture.  Post operatively had hypotension requiring initiation of pressors.  Past Medical History  Dementia anemia  Significant Hospital Events   3/17 admission 3/20 DNR  Consults:  Orthopedics Cardiology Palliative care  Procedures:  ORIF Left femur hip 3/18   Significant Diagnostic Tests:  3/18 TTE > LVEF 20-25%, left ventricle with severe decreased function, global hypokinesis  Micro Data:  COVID 3/17 > negative  Antimicrobials:  Ancef > 3/18 perioperative vanc 3/20 >  Cefepime 3/20 >    Interim history/subjective:  Remains on pressors.    Objective   Blood pressure (!) 79/43, pulse 81, temperature 98.2 F (36.8 C), temperature source Axillary, resp. rate 18, height 5\' 4"  (1.626 m), weight 61 kg, SpO2 97 %.        Intake/Output Summary (Last 24 hours) at 05/10/2019 1354 Last data filed at 05/10/2019 1115 Gross per 24 hour  Intake 1627.02 ml  Output 1000 ml  Net 627.02 ml   Filed Weights   05/08/19 0444 05/09/19 0500 05/10/19 0500  Weight: 58.1 kg 60.7 kg 61 kg    Examination:  General - alert Eyes - pupils reactive ENT - no sinus tenderness, no stridor Cardiac - irregular Chest - equal breath sounds b/l, no wheezing or rales Abdomen - soft, non tender, + bowel sounds Extremities - decreased muscle bulk Skin - no rashes Neuro - pleasantly confused, follows commands  Resolved Hospital Problem list   AKI from ATN in setting of shock  Assessment & Plan:   Shock. - likely from sepsis and cardiogenic - pressors through peripheral IV >> max dose 10 mcg/min levophed - continue solu cortef  HCAP. - day 2 of ABx  Acute systolic CHF. New onset PAF. - continue  amiodarone  Left femur hip fracture - per orthopedics - d/w family my concerns about pt's ability to participate in rehab  Anemia after surgery. - f/u CBC  Goals of care. - appreciate help from palliative care - DNR/DNI, no escalation of care - one family member will be returning to [place] on 05/13/19; they would like to wait to make any further decisions until then - family understands that if her status gets worse before 05/13/19, then we will allow her to pass peacefully  Pressure injuries. - stage 2 sacrum, not present on admission - wound care   Best practice:  Diet: D3 DVT prophylaxis: lovenox GI prophylaxis: n/a Mobility: bed rest Code Status: DNR Family Communication: updated daughter at bedside Disposition: ICU  Labs    CMP Latest Ref Rng & Units 05/10/2019 05/09/2019 05/08/2019  Glucose 70 - 99 mg/dL 203(H) 129(H) 127(H)  BUN 8 - 23 mg/dL 28(H) 31(H) 25(H)  Creatinine 0.44 - 1.00 mg/dL 0.86 1.08(H) 0.79  Sodium 135 - 145 mmol/L 135 138 141  Potassium 3.5 - 5.1 mmol/L 3.3(L) 3.7 4.6  Chloride 98 - 111 mmol/L 102 101 105  CO2 22 - 32 mmol/L 22 27 30   Calcium 8.9 - 10.3 mg/dL 8.4(L) 8.6(L) 8.5(L)  Total Protein 6.5 - 8.1 g/dL 5.8(L) - -  Total Bilirubin 0.3 - 1.2 mg/dL 1.2 - -  Alkaline Phos 38 - 126 U/L 65 - -  AST 15 - 41 U/L 40 - -  ALT 0 - 44 U/L 13 - -    CBC Latest Ref Rng & Units 05/10/2019 05/09/2019 05/09/2019  WBC 4.0 - 10.5 K/uL 10.2 10.4 11.9(H)  Hemoglobin 12.0 - 15.0 g/dL 9.0(L) 7.9(L) 7.0(L)  Hematocrit 36.0 - 46.0 % 27.1(L) 24.4(L) 21.6(L)  Platelets 150 - 400 K/uL 129(L) 131(L) 120(L)     Critical care time: 33 minutes    Coralyn Helling, MD [facility] Pulmonary/Critical Care 05/10/2019, 2:01 PM

## 2019-05-10 NOTE — Progress Notes (Signed)
Pharmacy Antibiotic Note  Tiffany [name] is a 86 y.o. female admitted on 05/06/2019 with 3/17 after a mechanical fall which resulted in a left hip fracture.  Post operatively had hypotension requiring initiation of pressors.  Pt now with worsening infiltrate, fever.  Pharmacy has been consulted for cefepime and vancomycin dosing for sepsis.  05/10/2019 Scr 0.86, CrCl ~ 35.44mls/min  Plan: Increase Cefepime 2gm IV q12h Increase vancomycin to 1500mg  q48h (AUC 506.8, Scr 0.86) Follow renal function, cultures and clinical course  Height: 5\' 4"  (162.6 cm) Weight: 134 lb 7.7 oz (61 kg) IBW/kg (Calculated) : 54.7  Temp (24hrs), Avg:98.9 F (37.2 C), Min:96.6 F (35.9 C), Max:100.5 F (38.1 C)  Recent Labs  Lab 05/07/19 0530 05/07/19 0530 05/07/19 2226 05/08/19 0140 05/09/19 0521 05/09/19 1330 05/10/19 0217  WBC 7.4   < > 14.9* 11.4* 11.9* 10.4 10.2  CREATININE 0.69  --  0.77 0.79 1.08*  --  0.86  LATICACIDVEN  --   --   --   --   --  1.5  --    < > = values in this interval not displayed.    Estimated Creatinine Clearance: 35.3 mL/min (by C-G formula based on SCr of 0.86 mg/dL).    Allergies  Allergen Reactions  . Strawberry Flavor Hives and Rash    Strawberries     Thank you for allowing pharmacy to be a part of this patient's care.  05/11/19 RPh 05/10/2019, 7:55 AM

## 2019-05-11 DIAGNOSIS — I429 Cardiomyopathy, unspecified: Secondary | ICD-10-CM

## 2019-05-11 LAB — BASIC METABOLIC PANEL
Anion gap: 10 (ref 5–15)
BUN: 26 mg/dL — ABNORMAL HIGH (ref 8–23)
CO2: 24 mmol/L (ref 22–32)
Calcium: 8.4 mg/dL — ABNORMAL LOW (ref 8.9–10.3)
Chloride: 105 mmol/L (ref 98–111)
Creatinine, Ser: 0.63 mg/dL (ref 0.44–1.00)
GFR calc Af Amer: >60 mL/min (ref >60)
GFR calc non Af Amer: >60 mL/min (ref >60)
Glucose, Bld: 152 mg/dL — ABNORMAL HIGH (ref 70–99)
Potassium: 3.9 mmol/L (ref 3.5–5.1)
Sodium: 139 mmol/L (ref 135–145)

## 2019-05-11 LAB — CBC
HCT: 27.3 % — ABNORMAL LOW (ref 36.0–46.0)
Hemoglobin: 8.9 g/dL — ABNORMAL LOW (ref 12.0–15.0)
MCH: 31.4 pg (ref 26.0–34.0)
MCHC: 32.6 g/dL (ref 30.0–36.0)
MCV: 96.5 fL (ref 80.0–100.0)
Platelets: 191 10*3/uL (ref 150–400)
RBC: 2.83 MIL/uL — ABNORMAL LOW (ref 3.87–5.11)
RDW: 16.6 % — ABNORMAL HIGH (ref 11.5–15.5)
WBC: 11.5 10*3/uL — ABNORMAL HIGH (ref 4.0–10.5)
nRBC: 0 % (ref 0.0–0.2)

## 2019-05-11 MED ORDER — ALBUMIN HUMAN 5 % IV SOLN
25.0000 g | Freq: Once | INTRAVENOUS | Status: AC
  Administered 2019-05-11: 25 g via INTRAVENOUS
  Filled 2019-05-11: qty 500

## 2019-05-11 MED ORDER — AMIODARONE HCL 200 MG PO TABS
200.0000 mg | ORAL_TABLET | Freq: Two times a day (BID) | ORAL | Status: DC
  Administered 2019-05-11 – 2019-05-14 (×7): 200 mg via ORAL
  Filled 2019-05-11 (×7): qty 1

## 2019-05-11 MED ORDER — MIDODRINE HCL 5 MG PO TABS
5.0000 mg | ORAL_TABLET | Freq: Three times a day (TID) | ORAL | Status: DC
  Administered 2019-05-11 – 2019-05-13 (×3): 5 mg via ORAL
  Filled 2019-05-11 (×7): qty 1

## 2019-05-11 NOTE — Progress Notes (Signed)
Progress Note  Patient Name: Tiffany [name] Date of Encounter: 05/11/2019  Primary Cardiologist: Olga Millers, MD   Subjective   lethargic and drowsy.   Inpatient Medications    Scheduled Meds: . docusate sodium  100 mg Oral BID  . enoxaparin (LOVENOX) injection  40 mg Subcutaneous Q24H  . hydrocortisone sodium succinate  50 mg Intravenous Q6H  . influenza vaccine adjuvanted  0.5 mL Intramuscular Tomorrow-1000  . mouth rinse  15 mL Mouth Rinse BID  . mupirocin ointment  1 application Nasal BID   Continuous Infusions: . sodium chloride    . amiodarone 30 mg/hr (05/11/19 0921)  . ceFEPime (MAXIPIME) IV Stopped (05/11/19 0457)  . norepinephrine (LEVOPHED) Adult infusion 3 mcg/min (05/11/19 0921)  . vancomycin     PRN Meds: HYDROcodone-acetaminophen, metoCLOPramide **OR** metoCLOPramide (REGLAN) injection, morphine injection, ondansetron **OR** ondansetron (ZOFRAN) IV   Vital Signs    Vitals:   05/11/19 0730 05/11/19 0800 05/11/19 0830 05/11/19 0900  BP: 117/80 121/70 119/79 126/70  Pulse: 89 74 75 77  Resp:      Temp:      TempSrc:      SpO2: 96% 95% 95% 95%  Weight:      Height:        Intake/Output Summary (Last 24 hours) at 05/11/2019 1021 Last data filed at 05/11/2019 2542 Gross per 24 hour  Intake 1238.36 ml  Output 875 ml  Net 363.36 ml   Last 3 Weights 05/11/2019 05/10/2019 05/09/2019  Weight (lbs) 138 lb 3.7 oz 134 lb 7.7 oz 133 lb 13.1 oz  Weight (kg) 62.7 kg 61 kg 60.7 kg      Telemetry    Atrial fibrillation at 70-80s- Personally Reviewed  ECG    No new tracing   Physical Exam   GEN: Elderly frail chronically ill appearing female in no acute distress.   Neck: No JVD Cardiac: Ir Ir , no murmurs, rubs, or gallops.  Respiratory: Clear to auscultation bilaterally. GI: Soft, nontender, non-distended  MS: No edema; No deformity. Neuro:  Nonfocal  Psych: lethargic, demented   Labs    Chemistry Recent Labs  Lab 05/09/19 0521  05/10/19 0217 05/11/19 0308  NA 138 135 139  K 3.7 3.3* 3.9  CL 101 102 105  CO2 27 22 24   GLUCOSE 129* 203* 152*  BUN 31* 28* 26*  CREATININE 1.08* 0.86 0.63  CALCIUM 8.6* 8.4* 8.4*  PROT  --  5.8*  --   ALBUMIN  --  3.3*  --   AST  --  40  --   ALT  --  13  --   ALKPHOS  --  65  --   BILITOT  --  1.2  --   GFRNONAA 44* 58* >60  GFRAA 51* >60 >60  ANIONGAP 10 11 10      Hematology Recent Labs  Lab 05/09/19 1330 05/10/19 0217 05/11/19 0308  WBC 10.4 10.2 11.5*  RBC 2.52* 2.89* 2.83*  HGB 7.9* 9.0* 8.9*  HCT 24.4* 27.1* 27.3*  MCV 96.8 93.8 96.5  MCH 31.3 31.1 31.4  MCHC 32.4 33.2 32.6  RDW 15.8* 17.1* 16.6*  PLT 131* 129* 191    Radiology    No results found.  Cardiac Studies   Echo 05/07/19 1. Difficult to assess EF due to atrial fibrillation with RVR, but  appears severely reduced 20-25%. Would recommend to control rates and  re-check EF.  2. Left ventricular ejection fraction, by estimation, is 20 to 25%. The  left ventricle has severely decreased function. The left ventricle  demonstrates global hypokinesis. Left ventricular diastolic function could  not be evaluated.  3. Right ventricular systolic function is mildly reduced. The right  ventricular size is mildly enlarged. There is normal pulmonary artery  systolic pressure.  4. Left atrial size was mild to moderately dilated.  5. Right atrial size was mildly dilated.  6. The mitral valve is degenerative. Mild to moderate mitral valve  regurgitation. No evidence of mitral stenosis.  7. Tricuspid valve regurgitation is mild to moderate.  8. The aortic valve is tricuspid. Aortic valve regurgitation is mild.  Mild to moderate aortic valve sclerosis/calcification is present, without  any evidence of aortic stenosis.  9. The inferior vena cava is normal in size with <50% respiratory  variability, suggesting right atrial pressure of 8 mmHg.   Patient Profile     86 y.o. female  with a PMH of  anemia and dementia, who presented with hip pain after a fall at home resulting in a hip fracture s/p Open reduction internal fixation with intramedullary nail. S/p transfused PRBCs for anemia. Cardiology consulted for new onset afib and CHF.   Assessment & Plan    1. New onset afib  - Not on rate control agent 2nd to soft BP, now improving on pressors.  - On Amiodarone, HR in 70-80s - Not a candidate for anticoagulation due to ementia, falls and fractured hip. -  CHA2DS2-VASc of at least 4 - Seem by palliative care  2. Acute systolic CHF - Not candidate for aggressive measures - Does no appears to be fluid overloaded - May consider low dose BB if BP supports after off pressors  3. Left hip fracture s/p open reduction internal fixation with intramedullary nail  4. Post op anemia s/p transfusion  - Hgb stable 5. Deconditioning/ Failure to thrive - Palliative care following   For questions or updates, please contact [place] Please consult [email or web address] for contact info under        SignedLeanor Kail, PA  05/11/2019, 10:21 AM

## 2019-05-11 NOTE — Progress Notes (Signed)
NAME:  Tiffany [name], MRN:  [id number], DOB:  [birth year], LOS: 5 ADMISSION DATE:  05/06/2019, CONSULTATION DATE:  3/18 REFERRING MD:  Margo Aye, CHIEF COMPLAINT:  Hypotension   Brief History   86 y/o female admitted on 3/17 after a mechanical fall which resulted in a left hip fracture.  Post operatively had hypotension requiring initiation of pressors.  Past Medical History  Dementia anemia  Significant Hospital Events   3/17 admission  Consults:  orthopedics  Procedures:  ORIF Left femur hip 3/18   Significant Diagnostic Tests:  3/18 TTE > LVEF 20-25%, left ventricle with severe decreased function, global hypokinesis  Micro Data:  COVID 3/17 > negative  Antimicrobials:  Ancef > 3/18 perioperative vanc 3/20 >  Cefepime 3/20 >    Interim history/subjective:   Had more pain today Was more talkative yesterday Remains on low dose vasopressors  Objective   Blood pressure 119/76, pulse 86, temperature 98.3 F (36.8 C), temperature source Axillary, resp. rate 18, height 5\' 4"  (1.626 m), weight 62.7 kg, SpO2 96 %.        Intake/Output Summary (Last 24 hours) at 05/11/2019 0817 Last data filed at 05/11/2019 0457 Gross per 24 hour  Intake 1019.82 ml  Output 875 ml  Net 144.82 ml   Filed Weights   05/09/19 0500 05/10/19 0500 05/11/19 0500  Weight: 60.7 kg 61 kg 62.7 kg    Examination:  General:  Resting comfortably in bed HENT: NCAT OP clear PULM: CTA B, normal effort CV: RRR, no mgr GI: BS+, soft, nontender MSK: normal bulk and tone Neuro: awake, alert, no distress, MAEW   Resolved Hospital Problem list     Assessment & Plan:  Post op Hypotension: improving HCAP/Septic shock Add midodrine Wean off levophed for MAP > 65 Continue cefepime Continue hydrocortisone  Systolic congestive heart failure LVEF 20-25% Wean off levophed for MAP > 65 Monitor UOP Tele  Atrial fib Amiodarone infusion> convert to po Tele  Left femur hip fracture per  ortho  Hemorrhagic anemia Monitor for bleeding Transfuse PRBC for Hgb < 7 gm/dL   Mild AKI Monitor BMET and UOP Replace electrolytes as needed   Prognosis poor, plan transition to comfort measures 3/24.  If worsens between now and then will focus on comfort and not escalate care.  Best practice:  Diet: advance as tolerated Pain/Anxiety/Delirium protocol (if indicated): n/a VAP protocol (if indicated): n/a DVT prophylaxis: scd GI prophylaxis: n/a Glucose control: per TRH Mobility: bed rest Code Status: DNR Family Communication: updated Karen bedside at length Disposition: remain in ICU  Labs   CBC: Recent Labs  Lab 05/06/19 1411 05/07/19 0530 05/08/19 0140 05/08/19 0140 05/08/19 0828 05/09/19 0521 05/09/19 1330 05/10/19 0217 05/11/19 0308  WBC 8.1   < > 11.4*  --   --  11.9* 10.4 10.2 11.5*  NEUTROABS 5.0  --   --   --   --   --  7.1 8.6*  --   HGB 10.0*   < > 9.3*   < > 9.0* 7.0* 7.9* 9.0* 8.9*  HCT 31.7*   < > 29.1*   < > 28.3* 21.6* 24.4* 27.1* 27.3*  MCV 103.3*   < > 100.3*  --   --  100.9* 96.8 93.8 96.5  PLT 128*   < > 92*  --   --  120* 131* 129* 191   < > = values in this interval not displayed.    Basic Metabolic Panel: Recent Labs  Lab 05/07/19 2226  05/08/19 0140 05/09/19 0521 05/10/19 0217 05/11/19 0308  NA 141 141 138 135 139  K 4.4 4.6 3.7 3.3* 3.9  CL 104 105 101 102 105  CO2 25 30 27 22 24   GLUCOSE 169* 127* 129* 203* 152*  BUN 24* 25* 31* 28* 26*  CREATININE 0.77 0.79 1.08* 0.86 0.63  CALCIUM 8.4* 8.5* 8.6* 8.4* 8.4*  MG  --  1.5* 1.7  --   --   PHOS  --  3.3  --   --   --    GFR: Estimated Creatinine Clearance: 37.9 mL/min (by C-G formula based on SCr of 0.63 mg/dL). Recent Labs  Lab 05/09/19 0521 05/09/19 1330 05/10/19 0217 05/11/19 0308  PROCALCITON  --  0.76  --   --   WBC 11.9* 10.4 10.2 11.5*  LATICACIDVEN  --  1.5  --   --     Liver Function Tests: Recent Labs  Lab 05/10/19 0217  AST 40  ALT 13  ALKPHOS 65   BILITOT 1.2  PROT 5.8*  ALBUMIN 3.3*   No results for input(s): LIPASE, AMYLASE in the last 168 hours. No results for input(s): AMMONIA in the last 168 hours.  ABG No results found for: PHART, PCO2ART, PO2ART, HCO3, TCO2, ACIDBASEDEF, O2SAT   Coagulation Profile: Recent Labs  Lab 05/06/19 1411  INR 1.2    Cardiac Enzymes: No results for input(s): CKTOTAL, CKMB, CKMBINDEX, TROPONINI in the last 168 hours.  HbA1C: No results found for: HGBA1C  CBG: No results for input(s): GLUCAP in the last 168 hours.    Critical care time: 31 minutes     Roselie Awkward, MD [place] [facility] Pager: [phone] Cell: [phone] If no response, call [phone]

## 2019-05-11 NOTE — Progress Notes (Signed)
OT Note  Patient Details Name: Tiffany [name] MRN: [id number] DOB: August 09, 1923   Cancelled Treatment:    Reason Eval/Treat Not Completed: Other (comment)  Noted plan for SNF- will defer OT eval to SNF Lise Auer, OT Acute Rehabilitation Services [phone] Office- [phone]     [name], Karin Golden D 05/11/2019, 4:55 PM

## 2019-05-12 ENCOUNTER — Encounter: Payer: Self-pay | Admitting: *Deleted

## 2019-05-12 MED ORDER — HYDROCORTISONE NA SUCCINATE PF 100 MG IJ SOLR
50.0000 mg | Freq: Three times a day (TID) | INTRAMUSCULAR | Status: DC
  Administered 2019-05-12 – 2019-05-13 (×4): 50 mg via INTRAVENOUS
  Filled 2019-05-12 (×4): qty 2

## 2019-05-12 MED ORDER — ALBUMIN HUMAN 5 % IV SOLN
12.5000 g | Freq: Once | INTRAVENOUS | Status: AC
  Administered 2019-05-12: 12.5 g via INTRAVENOUS
  Filled 2019-05-12: qty 500

## 2019-05-12 NOTE — TOC Progression Note (Signed)
Transition of Care [facility]) - Progression Note    Patient Details  Name: Tiffany [name] MRN: [id number] Date of Birth: [birth year]  Transition of [facility]) CM/SW Contact  Lennart Pall, LCSW Phone Number: 05/12/2019, 1:00 PM  Clinical Narrative:   Met with pt's granddaughter this morning, Naomi, to touch base on pt's current status.  Granddaughter reports pt appears to be more stable today and is "rallying", however, notes the family has been preparing for end of life.  Noted that TOC will continue to monitor her status and here to assist with d/c planning as recommended by MD which could be SNF vs potential home with Hospice? Pt has now transferred to another unit and TOC will continue to follow.    Expected Discharge Plan: [place] Barriers to Discharge: Continued Medical Work up  Expected Discharge Plan and Services Expected Discharge Plan: [place] Choice: [place] arrangements for the past 2 months: Single Family Home                                       Social Determinants of Health (SDOH) Interventions    Readmission Risk Interventions No flowsheet data found.

## 2019-05-12 NOTE — Progress Notes (Signed)
NAME:  Tiffany [name], MRN:  [id number], DOB:  November 27, 1923, LOS: 6 ADMISSION DATE:  05/06/2019, CONSULTATION DATE:  3/18 REFERRING MD:  Margo Aye, CHIEF COMPLAINT:  Hypotension   Brief History   86 y/o female admitted on 3/17 after a mechanical fall which resulted in a left hip fracture.  Post operatively had hypotension requiring initiation of pressors.  Past Medical History  Dementia anemia  Significant Hospital Events   3/17 admission  Consults:  orthopedics  Procedures:  ORIF Left femur hip 3/18   Significant Diagnostic Tests:  3/18 TTE > LVEF 20-25%, left ventricle with severe decreased function, global hypokinesis  Micro Data:  COVID 3/17 > negative  Antimicrobials:  Ancef > 3/18 perioperative vanc 3/20 > all Cefepime 3/20 >    Interim history/subjective:  Vasopressors are off.  Plan to transfer to floor  Objective   Blood pressure 115/66, pulse 70, temperature (!) 97.4 F (36.3 C), temperature source Axillary, resp. rate 18, height 5\' 4"  (1.626 m), weight 64.1 kg, SpO2 92 %.        Intake/Output Summary (Last 24 hours) at 05/12/2019 0803 Last data filed at 05/12/2019 0330 Gross per 24 hour  Intake 954.05 ml  Output 575 ml  Net 379.05 ml   Filed Weights   05/10/19 0500 05/11/19 0500 05/12/19 0500  Weight: 61 kg 62.7 kg 64.1 kg    Examination:  General: Elderly female who appears somewhat confused HEENT: No JVD or lymphadenopathy is appreciated Neuro: Follows commands at times CV: Heart sounds are irregular PULM: Diminished in the base GI: soft, bsx4 active  Extremities: warm/dry,  edema  Skin: no rashes or lesions    Resolved Hospital Problem list     Assessment & Plan:  Post op Hypotension: improving HCAP/Septic shock Continue midodrine Continue antimicrobial therapy Weaning hydrocortisone  Systolic congestive heart failure LVEF 20-25% Off vasopressors Monitor urine output   Atrial fib Cardiology has signed off Rate control with  current interventions  Left femur hip fracture Per orthopedics  Hemorrhagic anemia Recent Labs    05/10/19 0217 05/11/19 0308  HGB 9.0* 8.9*    Transfuse per protocol L   Mild AKI Lab Results  Component Value Date   CREATININE 0.63 05/11/2019   CREATININE 0.86 05/10/2019   CREATININE 1.08 (H) 05/09/2019    Monitor creatinine avoid nephrotoxins   DNR.  Transfer to floor.  Not requiring vasopressors.  Best practice:  Diet: advance as tolerated Pain/Anxiety/Delirium protocol (if indicated): n/a VAP protocol (if indicated): n/a DVT prophylaxis: scd GI prophylaxis: n/a Glucose control: Sliding scale insulin protocol Mobility: bed rest Code Status: DNR Family Communication: updated Karen bedside at length Disposition: Transfer out of intensive care unit goal.  Goal of care beingcomfort  Labs   CBC: Recent Labs  Lab 05/06/19 1411 05/07/19 0530 05/08/19 0140 05/08/19 0140 05/08/19 0828 05/09/19 0521 05/09/19 1330 05/10/19 0217 05/11/19 0308  WBC 8.1   < > 11.4*  --   --  11.9* 10.4 10.2 11.5*  NEUTROABS 5.0  --   --   --   --   --  7.1 8.6*  --   HGB 10.0*   < > 9.3*   < > 9.0* 7.0* 7.9* 9.0* 8.9*  HCT 31.7*   < > 29.1*   < > 28.3* 21.6* 24.4* 27.1* 27.3*  MCV 103.3*   < > 100.3*  --   --  100.9* 96.8 93.8 96.5  PLT 128*   < > 92*  --   --  120* 131* 129* 191   < > = values in this interval not displayed.    Basic Metabolic Panel: Recent Labs  Lab 05/07/19 2226 05/08/19 0140 05/09/19 0521 05/10/19 0217 05/11/19 0308  NA 141 141 138 135 139  K 4.4 4.6 3.7 3.3* 3.9  CL 104 105 101 102 105  CO2 25 30 27 22 24   GLUCOSE 169* 127* 129* 203* 152*  BUN 24* 25* 31* 28* 26*  CREATININE 0.77 0.79 1.08* 0.86 0.63  CALCIUM 8.4* 8.5* 8.6* 8.4* 8.4*  MG  --  1.5* 1.7  --   --   PHOS  --  3.3  --   --   --    GFR: Estimated Creatinine Clearance: 37.9 mL/min (by C-G formula based on SCr of 0.63 mg/dL). Recent Labs  Lab 05/09/19 0521 05/09/19 1330  05/10/19 0217 05/11/19 0308  PROCALCITON  --  0.76  --   --   WBC 11.9* 10.4 10.2 11.5*  LATICACIDVEN  --  1.5  --   --     Liver Function Tests: Recent Labs  Lab 05/10/19 0217  AST 40  ALT 13  ALKPHOS 65  BILITOT 1.2  PROT 5.8*  ALBUMIN 3.3*   No results for input(s): LIPASE, AMYLASE in the last 168 hours. No results for input(s): AMMONIA in the last 168 hours.  ABG No results found for: PHART, PCO2ART, PO2ART, HCO3, TCO2, ACIDBASEDEF, O2SAT   Coagulation Profile: Recent Labs  Lab 05/06/19 1411  INR 1.2    Cardiac Enzymes: No results for input(s): CKTOTAL, CKMB, CKMBINDEX, TROPONINI in the last 168 hours.  HbA1C: No results found for: HGBA1C  CBG: No results for input(s): GLUCAP in the last 168 hours.         Richardson Landry Makyiah Lie ACNP Acute Care Nurse Practitioner [place] Please consult Amion 05/12/2019, 8:04 AM

## 2019-05-12 NOTE — Progress Notes (Signed)
eLink Physician-Brief Progress Note Patient Name: Tiffany [name] DOB: 19-Aug-1923 MRN: [id number]   Date of Service  05/12/2019  HPI/Events of Note  Hypotension  eICU Interventions  Albumin 5 % 12.5 gm iv x 1        Jonay Hitchcock U Phillippe Orlick 05/12/2019, 3:29 AM

## 2019-05-13 DIAGNOSIS — I5021 Acute systolic (congestive) heart failure: Secondary | ICD-10-CM

## 2019-05-13 LAB — BASIC METABOLIC PANEL
Anion gap: 9 (ref 5–15)
BUN: 39 mg/dL — ABNORMAL HIGH (ref 8–23)
CO2: 23 mmol/L (ref 22–32)
Calcium: 8.6 mg/dL — ABNORMAL LOW (ref 8.9–10.3)
Chloride: 104 mmol/L (ref 98–111)
Creatinine, Ser: 0.85 mg/dL (ref 0.44–1.00)
GFR calc Af Amer: >60 mL/min (ref >60)
GFR calc non Af Amer: 59 mL/min — ABNORMAL LOW (ref >60)
Glucose, Bld: 126 mg/dL — ABNORMAL HIGH (ref 70–99)
Potassium: 4.7 mmol/L (ref 3.5–5.1)
Sodium: 136 mmol/L (ref 135–145)

## 2019-05-13 LAB — MAGNESIUM: Magnesium: 1.7 mg/dL (ref 1.7–2.4)

## 2019-05-13 LAB — PHOSPHORUS: Phosphorus: 1.7 mg/dL — ABNORMAL LOW (ref 2.5–4.6)

## 2019-05-13 MED ORDER — BISACODYL 10 MG RE SUPP
10.0000 mg | Freq: Every day | RECTAL | Status: DC | PRN
  Administered 2019-05-13 – 2019-05-14 (×2): 10 mg via RECTAL
  Filled 2019-05-13 (×2): qty 1

## 2019-05-13 MED ORDER — HYDROCORTISONE NA SUCCINATE PF 100 MG IJ SOLR
50.0000 mg | Freq: Two times a day (BID) | INTRAMUSCULAR | Status: DC
  Administered 2019-05-13 – 2019-05-14 (×2): 50 mg via INTRAVENOUS
  Filled 2019-05-13 (×2): qty 2

## 2019-05-13 MED ORDER — POLYETHYLENE GLYCOL 3350 17 G PO PACK
17.0000 g | PACK | Freq: Every day | ORAL | Status: DC | PRN
  Administered 2019-05-13 – 2019-05-14 (×2): 17 g via ORAL
  Filled 2019-05-13 (×2): qty 1

## 2019-05-13 MED ORDER — MIDODRINE HCL 5 MG PO TABS
5.0000 mg | ORAL_TABLET | Freq: Two times a day (BID) | ORAL | Status: DC
  Administered 2019-05-13 – 2019-05-14 (×2): 5 mg via ORAL
  Filled 2019-05-13 (×3): qty 1

## 2019-05-13 MED ORDER — SENNOSIDES-DOCUSATE SODIUM 8.6-50 MG PO TABS
1.0000 | ORAL_TABLET | Freq: Two times a day (BID) | ORAL | Status: DC
  Administered 2019-05-13 – 2019-05-14 (×3): 1 via ORAL
  Filled 2019-05-13 (×3): qty 1

## 2019-05-13 MED ORDER — MORPHINE SULFATE (CONCENTRATE) 10 MG/0.5ML PO SOLN
5.0000 mg | ORAL | Status: DC | PRN
  Administered 2019-05-13 – 2019-05-14 (×2): 10 mg via ORAL
  Filled 2019-05-13 (×2): qty 0.5

## 2019-05-13 MED ORDER — FLEET ENEMA 7-19 GM/118ML RE ENEM
1.0000 | ENEMA | Freq: Once | RECTAL | Status: AC
  Administered 2019-05-13: 1 via RECTAL
  Filled 2019-05-13: qty 1

## 2019-05-13 MED ORDER — ACETAMINOPHEN 325 MG PO TABS
650.0000 mg | ORAL_TABLET | Freq: Four times a day (QID) | ORAL | Status: DC
  Administered 2019-05-13: 650 mg via ORAL
  Filled 2019-05-13 (×2): qty 2

## 2019-05-13 NOTE — Care Management Important Message (Signed)
Important Message  Patient Details IM Letter given to Sandford Craze RN Case Manager to present to the Patient Name: Tiffany [name] MRN: [id number] Date of Birth: 24-May-1923   Medicare Important Message Given:  Yes     Caren Macadam 05/13/2019, 10:09 AM

## 2019-05-13 NOTE — TOC Transition Note (Signed)
Transition of Care [facility]) - CM/SW Discharge Note   Patient Details  Name: Tiffany [name] MRN: [id number] Date of Birth: 04-Dec-1923  [facility] CM/SW Contact:  Bartholome Bill, RN Phone Number: 05/13/2019, 12:36 PM   Clinical Narrative:    This CM received call from PMT MD about dc planning. Daughter would like to take pt home with hospice. [facility] was chosen and Production manager contacted for referral. TOC will continue to follow.   Final next level of care: Home w Hospice Care Barriers to Discharge: Continued Medical Work up   Patient Goals and CMS Choice    HH Agency: [facility] Date [facility] Agency Contacted: 05/13/19 Time HH Agency Contacted: 1236 Representative spoke with at [facility] Agency: Nita Sells  Social Determinants of Health (SDOH) Interventions     Readmission Risk Interventions No flowsheet data found.

## 2019-05-13 NOTE — Progress Notes (Signed)
Daily Progress Note   Patient Name: Tiffany [name]       Date: 05/13/2019 DOB: 16-Oct-1923  Age: 86 y.o. MRN#: [id number] Attending Physician: Aline August, MD Primary Care Physician: System, Pcp Not In Admit Date: 05/06/2019  Reason for Consultation/Follow-up: Establishing goals of care  Subjective: I met today with patient's daughter, Tiffany [name]. We discussed clinical course as well as wishes moving forward in regard to care plan for her mother moving forward.  We discussed difference between a aggressive medical intervention path and a palliative, comfort focused care path.  Values and goals of care important to patient and family were attempted to be elicited.  Tiffany [name] reports that overall, family goal is for her to be as comfortable as possible and to be able to see family who are traveling to visit her.  Concept of Hospice and Palliative Care were discussed.  We discussed options for hospice care including home hospice vs residential hospice.  Questions and concerns addressed.   PMT will continue to support holistically.  Length of Stay: 7  Current Medications: Scheduled Meds:  . acetaminophen  650 mg Oral Q6H  . amiodarone  200 mg Oral BID  . enoxaparin (LOVENOX) injection  40 mg Subcutaneous Q24H  . hydrocortisone sodium succinate  50 mg Intravenous Q12H  . influenza vaccine adjuvanted  0.5 mL Intramuscular Tomorrow-1000  . mouth rinse  15 mL Mouth Rinse BID  . midodrine  5 mg Oral BID WC  . senna-docusate  1 tablet Oral BID    Continuous Infusions: . sodium chloride    . ceFEPime (MAXIPIME) IV 2 g (05/13/19 1510)    PRN Meds: bisacodyl, metoCLOPramide **OR** metoCLOPramide (REGLAN) injection, morphine injection, morphine CONCENTRATE, ondansetron **OR** ondansetron (ZOFRAN)  IV, polyethylene glycol  Physical Exam   Sleeping throughout encounter.  Has been agitated and further exam deferred as she was resting comfortably.        Vital Signs: BP 108/73 (BP Location: Right Arm)   Pulse 100   Temp 98.5 F (36.9 C) (Oral)   Resp 14   Ht _0  (1.626 m)   Wt 64.1 kg   SpO2 94%   BMI 24.26 kg/m  SpO2: SpO2: 94 % O2 Device: O2 Device: Room Air O2 Flow Rate: O2 Flow Rate (L/min): 2 L/min  Intake/output summary:   Intake/Output  Summary (Last 24 hours) at 05/13/2019 2212 Last data filed at 05/13/2019 0700 Gross per 24 hour  Intake --  Output 300 ml  Net -300 ml   LBM: Last BM Date: 05/13/19 Baseline Weight: Weight: 61.2 kg Most recent weight: Weight: 64.1 kg       Palliative Assessment/Data:    Flowsheet Rows     Most Recent Value  Intake Tab  Referral Department  Hospitalist  Unit at Time of Referral  ICU  Palliative Care Primary Diagnosis  Cardiac  Date Notified  05/08/19  Palliative Care Type  New Palliative care  Reason for referral  Clarify Goals of Care  Date of Admission  05/06/19  Date first seen by Palliative Care  05/09/19  # of days Palliative referral response time  1 Day(s)  # of days IP prior to Palliative referral  2  Clinical Assessment  Psychosocial & Spiritual Assessment  Palliative Care Outcomes      Patient Active Problem List   Diagnosis Date Noted  . Pressure injury of skin 05/10/2019  . S/p left hip fracture 05/06/2019  . New onset a-fib ([place]) 05/06/2019  . Cellulitis 12/12/2016  . Cellulitis of right lower extremity 12/12/2016  . Vaginal prolapse 12/12/2016  . Difficulty walking     Palliative Care Assessment & Plan    Recommendations/Plan: Family would like to take [name] home with the support of home hospice.  We discussed plan to continue current antibiotics while she is in the hospital and transition to oral abx on discharge to complete this course (2 more days) but to forgo further antibiotics once  she is transitioned home. Family would like to work with [employer] on discharge.  Appreciate care management assistance. For pain: Schedule tylenol.  Trial of roxanol to ensure it is effective for discharge. On discharge, would recommend scripts for: - Morphine Concentrate 8m/0.88ml: 5-149m(0.25-0.88m47msublingual every 1 hour as needed for pain or shortness of breath: Disp 20m71mLorazepam 2mg/20mconcentrated solution: 1mg (88mml) s43mingual every 4 hours as needed for anxiety: Disp 20ml - 36mol 2mg/ml s71mtion: 0.88mg (0.251m subl43mal every 4 hours as needed for agitation or nausea: Disp 20ml    Cod28matus:    Code Status Orders  (From admission, onward)         Start     Ordered   05/08/19 0944  Do not attempt resuscitation (DNR)  Continuous    Question Answer Comment  In the event of cardiac or respiratory ARREST Do not call a "code blue"   In the event of cardiac or respiratory ARREST Do not perform Intubation, CPR, defibrillation or ACLS   In the event of cardiac or respiratory ARREST Use medication by any route, position, wound care, and other measures to relive pain and suffering. May use oxygen, suction and manual treatment of airway obstruction as needed for comfort.   Comments Per daughter, Tiffany [name] ([email or web address]), patient would not want advanced measures taken to prolong her life.      05/08/19 0944        Code Status History    Date Active Date Inactive Code Status Order ID Comments User Context   05/06/2019 1731 05/08/2019 0944 Full Code [id number]  [id number] Odea/24/2018 0312 10/24/[phone] Full Code [id number]  [id number], Rise Patiencevance Care Planning Activity    Advance Directive Documentation     Most Recent Value  Type of Advance Directive  Healthcare Power of Attorney,[place]  Living will  Pre-existing out of facility DNR order (yellow form or pink MOST form)  --  "MOST" Form in Place?  --      Prognosis:  < 6 months  Discharge  Planning: Home with Hospice  Care plan was discussed with daughter  Thank you for allowing the Palliative Medicine Team to assist in the care of this patient.   Time In: 1140 Time Out: 1230 Total Time 40 Prolonged Time Billed no      Greater than 50%  of this time was spent counseling and coordinating care related to the above assessment and plan.  Micheline Rough, MD  Please contact Palliative Medicine Team phone at [phone] for questions and concerns.

## 2019-05-13 NOTE — Progress Notes (Signed)
Patient ID: Tiffany [name], female   DOB: 1923-05-06, 86 y.o.   MRN: [id number]  PROGRESS NOTE    Tiffany [name]  HQI:[id number] DOB: 11-Sep-1923 DOA: 05/06/2019 PCP: System, Pcp Not In   Brief Narrative:  86 year old female with history of dementia, anemia presented on 05/06/2019 after a mechanical fall which resulted in a left hip fracture.  She underwent ORIF on 05/07/2019 and postoperatively had hypotension requiring initiation of pressors and transferred to ICU.  She was started on broad-spectrum antibiotics for HCAP as well.  She was found to have new onset A. fib and CHF with EF of 20 to 25% for which she is currently on oral amiodarone.  Palliative care has been consulted for goals of care discussion.  She was transferred to [facility] service on May 13, 2019  Assessment & Plan:   Septic shock: Not present on admission Healthcare associate pneumonia Postop hypotension -Patient developed postop hypotension and septic shock and was transferred to ICU.  Shock is resolved.  Off pressors. -Transferred to [facility] service on May 13, 2019 -Blood pressure much improved.  Continue weaning hydrocortisone.  Continue midodrine for now. -Finish 7-day course of antibiotics; today is day #5 of cefepime.  T-max of 100.4 over the last 24 hours  New onset paroxysmal A. fib with RVR -Cardiology has signed off.  Currently rate controlled on oral amiodarone.  Patient is not a good candidate for anticoagulation therapy as per cardiology  Acute systolic congestive heart failure  -EF of 20 to 25%.  Off vasopressors.  Strict input and output and daily weights. -Cardiology has signed off  Left displaced comminuted intertrochanteric hip fracture -Status post ORIF on May 07, 2019 by orthopedics.  Orthopedics has signed off with outpatient follow-up recommendation. -Continue PT evaluation  Generalized deconditioning -Overall prognosis is guarded to poor.  Palliative care following.  Consider home hospice/residential  hospice if family agreeable.  Acute blood loss anemia -Most likely secondary to a fracture.  Hemoglobin stable.  Transfuse if hemoglobin is less than 7  Thrombocytopenia -Questionable cause.  Improved  Leukocytosis -On the minimal elevation of white count   DVT prophylaxis: SCDs Code Status: DNR Family Communication: None at bedside Disposition Plan: SNF versus residential hospice/home hospice if family agreeable in 1 to 2 days.  Consultants: PCCM/orthopedic/cardiology/palliative care  Procedures: ORIF on May 07, 2019  Antimicrobials:  Anti-infectives (From admission, onward)   Start     Dose/Rate Route Frequency Ordered Stop   05/11/19 1400  vancomycin (VANCOREADY) IVPB 1250 mg/250 mL  Status:  Discontinued     1,250 mg 166.7 mL/hr over 90 Minutes Intravenous Every 48 hours 05/09/19 1320 05/10/19 0758   05/11/19 1400  vancomycin (VANCOREADY) IVPB 1500 mg/300 mL  Status:  Discontinued     1,500 mg 150 mL/hr over 120 Minutes Intravenous Every 48 hours 05/10/19 0758 05/11/19 1133   05/10/19 1400  ceFEPIme (MAXIPIME) 2 g in sodium chloride 0.9 % 100 mL IVPB     2 g 200 mL/hr over 30 Minutes Intravenous Every 12 hours 05/10/19 0758 05/15/19 2359   05/09/19 1315  ceFEPIme (MAXIPIME) 2 g in sodium chloride 0.9 % 100 mL IVPB  Status:  Discontinued     2 g 200 mL/hr over 30 Minutes Intravenous Every 24 hours 05/09/19 1314 05/10/19 0758   05/09/19 1315  vancomycin (VANCOREADY) IVPB 1250 mg/250 mL     1,250 mg 166.7 mL/hr over 90 Minutes Intravenous  Once 05/09/19 1314 05/09/19 1545   05/07/19 2200  ceFAZolin (ANCEF)  IVPB 1 g/50 mL premix     1 g 100 mL/hr over 30 Minutes Intravenous Every 8 hours 05/07/19 2059 05/08/19 1416   05/07/19 1400  ceFAZolin (ANCEF) IVPB 2g/100 mL premix     2 g 200 mL/hr over 30 Minutes Intravenous On call to O.R. 05/06/19 2336 05/07/19 1521        Subjective: Patient seen and examined at bedside.  She is awake but extremely confused.  Had fever  earlier this morning.  No overnight vomiting, worsening shortness of breath noted.  Objective: Vitals:   05/12/19 1252 05/12/19 1623 05/12/19 2016 05/13/19 0546  BP:  112/61 119/82 128/90  Pulse:  81 100 100  Resp: 16 16 18 18   Temp: 98.1 F (36.7 C)  99.3 F (37.4 C) (!) 100.4 F (38 C)  TempSrc: Oral  Oral Oral  SpO2:  96% 94% 99%  Weight:      Height:        Intake/Output Summary (Last 24 hours) at 05/13/2019 1140 Last data filed at 05/13/2019 0700 Gross per 24 hour  Intake --  Output 550 ml  Net -550 ml   Filed Weights   05/10/19 0500 05/11/19 0500 05/12/19 0500  Weight: 61 kg 62.7 kg 64.1 kg    Examination:  General exam: Appears calm and comfortable.  Awake but extremely confused.  Speaks in sentences which do not make sense. Respiratory system: Bilateral decreased breath sounds at bases with some scattered crackles Cardiovascular system: S1 & S2 heard, Rate controlled Gastrointestinal system: Abdomen is nondistended, soft and nontender. Normal bowel sounds heard. Extremities: No cyanosis, clubbing; trace lower extremity edema Central nervous system: Awake but confused.  No focal neurological deficits. Moving extremities Skin: No rashes, lesions or ulcers Psychiatry: Could not be assessed because of mental status    Data Reviewed: I have personally reviewed following labs and imaging studies  CBC: Recent Labs  Lab 05/06/19 1411 05/07/19 0530 05/08/19 0140 05/08/19 0140 05/08/19 0828 05/09/19 0521 05/09/19 1330 05/10/19 0217 05/11/19 0308  WBC 8.1   < > 11.4*  --   --  11.9* 10.4 10.2 11.5*  NEUTROABS 5.0  --   --   --   --   --  7.1 8.6*  --   HGB 10.0*   < > 9.3*   < > 9.0* 7.0* 7.9* 9.0* 8.9*  HCT 31.7*   < > 29.1*   < > 28.3* 21.6* 24.4* 27.1* 27.3*  MCV 103.3*   < > 100.3*  --   --  100.9* 96.8 93.8 96.5  PLT 128*   < > 92*  --   --  120* 131* 129* 191   < > = values in this interval not displayed.   Basic Metabolic Panel: Recent Labs  Lab  05/08/19 0140 05/09/19 0521 05/10/19 0217 05/11/19 0308 05/13/19 0527  NA 141 138 135 139 136  K 4.6 3.7 3.3* 3.9 4.7  CL 105 101 102 105 104  CO2 30 27 22 24 23   GLUCOSE 127* 129* 203* 152* 126*  BUN 25* 31* 28* 26* 39*  CREATININE 0.79 1.08* 0.86 0.63 0.85  CALCIUM 8.5* 8.6* 8.4* 8.4* 8.6*  MG 1.5* 1.7  --   --  1.7  PHOS 3.3  --   --   --  1.7*   GFR: Estimated Creatinine Clearance: 35.7 mL/min (by C-G formula based on SCr of 0.85 mg/dL). Liver Function Tests: Recent Labs  Lab 05/10/19 0217  AST 40  ALT 13  ALKPHOS 65  BILITOT 1.2  PROT 5.8*  ALBUMIN 3.3*   No results for input(s): LIPASE, AMYLASE in the last 168 hours. No results for input(s): AMMONIA in the last 168 hours. Coagulation Profile: Recent Labs  Lab 05/06/19 1411  INR 1.2   Cardiac Enzymes: No results for input(s): CKTOTAL, CKMB, CKMBINDEX, TROPONINI in the last 168 hours. BNP (last 3 results) No results for input(s): PROBNP in the last 8760 hours. HbA1C: No results for input(s): HGBA1C in the last 72 hours. CBG: No results for input(s): GLUCAP in the last 168 hours. Lipid Profile: No results for input(s): CHOL, HDL, LDLCALC, TRIG, CHOLHDL, LDLDIRECT in the last 72 hours. Thyroid Function Tests: No results for input(s): TSH, T4TOTAL, FREET4, T3FREE, THYROIDAB in the last 72 hours. Anemia Panel: No results for input(s): VITAMINB12, FOLATE, FERRITIN, TIBC, IRON, RETICCTPCT in the last 72 hours. Sepsis Labs: Recent Labs  Lab 05/09/19 1330  PROCALCITON 0.76  LATICACIDVEN 1.5    Recent Results (from the past 240 hour(s))  Respiratory Panel by RT PCR (Flu A&B, Covid) - Nasopharyngeal Swab     Status: None   Collection Time: 05/06/19  2:50 PM   Specimen: Nasopharyngeal Swab  Result Value Ref Range Status   SARS Coronavirus 2 by RT PCR NEGATIVE NEGATIVE Final    Comment: (NOTE) SARS-CoV-2 target nucleic acids are NOT DETECTED. The SARS-CoV-2 RNA is generally detectable in upper  respiratoy specimens during the acute phase of infection. The lowest concentration of SARS-CoV-2 viral copies this assay can detect is 131 copies/mL. A negative result does not preclude SARS-Cov-2 infection and should not be used as the sole basis for treatment or other patient management decisions. A negative result may occur with  improper specimen collection/handling, submission of specimen other than nasopharyngeal swab, presence of viral mutation(s) within the areas targeted by this assay, and inadequate number of viral copies (<131 copies/mL). A negative result must be combined with clinical observations, patient history, and epidemiological information. The expected result is Negative. Fact Sheet for Patients:  PinkCheek.be Fact Sheet for Healthcare Providers:  GravelBags.it This test is not yet ap proved or cleared by the [place] FDA and  has been authorized for detection and/or diagnosis of SARS-CoV-2 by FDA under an Emergency Use Authorization (EUA). This EUA will remain  in effect (meaning this test can be used) for the duration of the COVID-19 declaration under Section 564(b)(1) of the Act, 21 U.S.C. section 360bbb-3(b)(1), unless the authorization is terminated or revoked sooner.    Influenza A by PCR NEGATIVE NEGATIVE Final   Influenza B by PCR NEGATIVE NEGATIVE Final    Comment: (NOTE) The Xpert Xpress SARS-CoV-2/FLU/RSV assay is intended as an aid in  the diagnosis of influenza from Nasopharyngeal swab specimens and  should not be used as a sole basis for treatment. Nasal washings and  aspirates are unacceptable for Xpert Xpress SARS-CoV-2/FLU/RSV  testing. Fact Sheet for Patients: PinkCheek.be Fact Sheet for Healthcare Providers: GravelBags.it This test is not yet approved or cleared by the [place] FDA and  has been authorized for  detection and/or diagnosis of SARS-CoV-2 by  FDA under an Emergency Use Authorization (EUA). This EUA will remain  in effect (meaning this test can be used) for the duration of the  Covid-19 declaration under Section 564(b)(1) of the Act, 21  U.S.C. section 360bbb-3(b)(1), unless the authorization is  terminated or revoked. Performed at [facility], [place] [address], [place], [place] [address]   Surgical pcr screen  Status: Abnormal   Collection Time: 05/07/19  4:55 AM   Specimen: Nasal Mucosa; Nasal Swab  Result Value Ref Range Status   MRSA, PCR NEGATIVE NEGATIVE Final   Staphylococcus aureus POSITIVE (A) NEGATIVE Final    Comment: (NOTE) The Xpert SA Assay (FDA approved for NASAL specimens in patients 68 years of age and older), is one component of a comprehensive surveillance program. It is not intended to diagnose infection nor to guide or monitor treatment. Performed at [facility], [address] [place], [place] [address]   Culture, Urine     Status: None   Collection Time: 05/09/19  1:12 PM   Specimen: Urine, Random  Result Value Ref Range Status   Specimen Description URINE, RANDOM  Final   Special Requests   Final    Normal Performed at [facility], [address], [place], [place] [address]    Culture NO GROWTH  Final   Report Status 05/10/2019 FINAL  Final  Culture, blood (routine x 2)     Status: None (Preliminary result)   Collection Time: 05/09/19  1:30 PM   Specimen: BLOOD RIGHT HAND  Result Value Ref Range Status   Specimen Description   Final    BLOOD RIGHT HAND Performed at [facility], [address] [place], [place] [address]    Special Requests   Final    BOTTLES DRAWN AEROBIC AND ANAEROBIC Blood Culture adequate volume Performed at [facility], [address], [place], [place] [address]    Culture   Final    NO GROWTH 4 DAYS Performed at [facility], [address] [place], [place] [address]    Report Status PENDING  Incomplete  Culture, blood (routine x 2)     Status: None (Preliminary result)   Collection Time: 05/09/19  1:30 PM   Specimen: BLOOD LEFT HAND  Result Value Ref Range Status   Specimen Description   Final    BLOOD LEFT HAND Performed at [facility], [address], [place], [place] [address]    Special Requests   Final    BOTTLES DRAWN AEROBIC ONLY Blood Culture adequate volume Performed at [facility], [address], [place], [place] [address]    Culture   Final    NO GROWTH 4 DAYS Performed at [facility], [address] [place], [place] [address]    Report Status PENDING  Incomplete         Radiology Studies: No results found.      Scheduled Meds: . amiodarone  200 mg Oral BID  . enoxaparin (LOVENOX) injection  40 mg Subcutaneous Q24H  . hydrocortisone sodium succinate  50 mg Intravenous Q8H  . influenza vaccine adjuvanted  0.5 mL Intramuscular Tomorrow-1000  . mouth rinse  15 mL Mouth Rinse BID  . midodrine  5 mg Oral TID WC  . senna-docusate  1 tablet Oral BID   Continuous Infusions: . sodium chloride    . ceFEPime (MAXIPIME) IV 2 g (05/13/19 0110)          Glade Lloyd, MD [facility] 05/13/2019, 11:40 AM

## 2019-05-13 NOTE — Progress Notes (Signed)
Physical Therapy Discharge Patient Details Name: Tiffany [name] MRN: [id number] DOB: [birth year] Today's Date: 05/13/2019 Time:  -     Patient discharged from PT services secondary to medical decline - plans are being made to go home with Hospice.  Please see latest therapy progress note for current level of functioning and progress toward goals.    Progress and discharge plan discussed with patient and/or caregiver:NA GP     Chessa Barrasso, Tilden Fossa PT Acute Rehabilitation Services Pager [phone] Office [phone]  05/13/2019, 2:13 PM

## 2019-05-13 NOTE — Progress Notes (Addendum)
Patent examiner [facility]) Hospital Liaison: RN note     Notified by Sandford Craze, RN Transition of Care Manger of patient/family request for [facility] services at home after discharge. Chart and patient information reviewed by [facility] physician. Hospice eligibility confirmed.     Writer spoke with daughter, Clydie Braun to initiate education related to hospice philosophy, services and team approach to care. Clydie Braun verbalized understanding of information given. Per discussion, plan is for discharge to home by  PTAR.   Please send signed and completed DNR form home with patient/family. Patient will need prescriptions for discharge comfort medications.     DME needs have been discussed, patient currently has the following equipment in the home:       walker.  Patient/family requests the following DME for delivery to the home: Hospital bed, 3N1, W/C. [facility] equipment manager has been notified and will contact DME provider to arrange delivery to the home. Home address has been verified and is correct in the chart. Clydie Braun  is the family member to contact to arrange time of delivery.     [facility] Referral Center aware of the above. Please notify [facility] when patient is ready to leave the unit at discharge. (Call [phone] or [phone] after 5pm.) [facility] information and contact numbers given to  Clydie Braun.       Please call with any hospice related questions.     Thank you for this referral.      Elsie Saas, RN, [facility] (listed on AMION under [facility]) [phone]

## 2019-05-13 NOTE — Progress Notes (Signed)
Pharmacy Antibiotic Note  Tiffany [name] is a 86 y.o. female admitted on 05/06/2019 with 3/17 after a mechanical fall which resulted in a left hip fracture.  Post operatively had hypotension requiring initiation of pressors.  Pt now with worsening infiltrate, fever.  Pharmacy has been consulted for cefepime dosing for HCAP/sepsis.  Plan: Continue cefepime 2gm IV q12h Follow renal function, cultures and clinical course  Height: 5\' 4"  (162.6 cm) Weight: 141 lb 5 oz (64.1 kg) IBW/kg (Calculated) : 54.7  Temp (24hrs), Avg:99.3 F (37.4 C), Min:98.1 F (36.7 C), Max:100.4 F (38 C)  Recent Labs  Lab 05/08/19 0140 05/09/19 0521 05/09/19 1330 05/10/19 0217 05/11/19 0308 05/13/19 0527  WBC 11.4* 11.9* 10.4 10.2 11.5*  --   CREATININE 0.79 1.08*  --  0.86 0.63 0.85  LATICACIDVEN  --   --  1.5  --   --   --     Estimated Creatinine Clearance: 35.7 mL/min (by C-G formula based on SCr of 0.85 mg/dL).    Allergies  Allergen Reactions  . Strawberry Flavor Hives and Rash    Strawberries   Antimicrobials this admission:  3/20 vanc >>3/22 3/20 cefepime >> Dose adjustments this admission:  3/21 V 1250 q48> 1500 q48 Microbiology results:  3/20  BCx2: ngtd 3/20 UCx: ngF 3/18 MRSA PCR neg  3/18 SA PCR pos 3/17 COVID, Influenza screen: neg  Thank you for allowing pharmacy to be a part of this patient's care.  4/17 PharmD, BCPS Pager: [phone] 05/13/2019 9:14 AM

## 2019-05-14 DIAGNOSIS — J189 Pneumonia, unspecified organism: Secondary | ICD-10-CM

## 2019-05-14 LAB — CULTURE, BLOOD (ROUTINE X 2)
Culture: NO GROWTH
Culture: NO GROWTH
Special Requests: ADEQUATE
Special Requests: ADEQUATE

## 2019-05-14 MED ORDER — ONDANSETRON HCL 4 MG PO TABS: 4.0000 mg | ORAL_TABLET | Freq: Four times a day (QID) | ORAL | 0 refills | Status: AC | PRN

## 2019-05-14 MED ORDER — MORPHINE SULFATE (CONCENTRATE) 10 MG/0.5ML PO SOLN: 5.0000 mg | ORAL | 0 refills | Status: AC | PRN

## 2019-05-14 MED ORDER — CEFUROXIME AXETIL 500 MG PO TABS: 500.0000 mg | ORAL_TABLET | Freq: Two times a day (BID) | ORAL | 0 refills | Status: AC

## 2019-05-14 MED ORDER — SENNOSIDES-DOCUSATE SODIUM 8.6-50 MG PO TABS: 1.0000 | ORAL_TABLET | Freq: Two times a day (BID) | ORAL | 0 refills | Status: AC

## 2019-05-14 MED ORDER — AMIODARONE HCL 200 MG PO TABS: 200.0000 mg | ORAL_TABLET | Freq: Two times a day (BID) | ORAL | 0 refills | Status: AC

## 2019-05-14 MED ORDER — BISACODYL 10 MG RE SUPP: 10.0000 mg | Freq: Every day | RECTAL | 0 refills | Status: AC | PRN

## 2019-05-14 MED ORDER — POLYETHYLENE GLYCOL 3350 17 G PO PACK: 17.0000 g | PACK | Freq: Every day | ORAL | 0 refills | Status: AC | PRN

## 2019-05-14 NOTE — TOC Transition Note (Signed)
Transition of Care [facility]) - CM/SW Discharge Note   Patient Details  Name: Tiffany [name] MRN: [id number] Date of Birth: Mar 01, 1923  Transition of Care [facility]) CM/SW Contact:  Bartholome Bill, RN Phone Number: 05/14/2019, 12:00 PM   Clinical Narrative:     This CM spoke with pt daughter Clydie Braun and son Iantha Fallen about [facility]. Per Clydie Braun, the DME is to be delivered to the home before 2pm. Per family request PTAR transport set up for 2:30pm. Yellow DNR on front of shadow chart.   Final next level of care: Home w Hospice Care Barriers to Discharge: Continued Medical Work up   Patient Goals and CMS Choice   CMS Medicare.gov Compare Post Acute Care list provided to:: Other (Comment Required)(Daughter) Choice offered to / list presented to : Adult Children          HH Agency: [facility] Date [facility] Agency Contacted: 05/13/19 Time HH Agency Contacted: [phone] Representative spoke with at [facility] Agency: Nita Sells  Social Determinants of Health (SDOH) Interventions     Readmission Risk Interventions No flowsheet data found.

## 2019-05-14 NOTE — Discharge Summary (Signed)
Physician Discharge Summary  Tiffany [name] HDQ:[id number] DOB: 02/09/1924 DOA: 05/06/2019  PCP: System, Pcp Not In  Admit date: 05/06/2019 Discharge date: 05/14/2019  Admitted From: Home Disposition: Home with hospice  Recommendations for Outpatient Follow-up:  1. Follow up with PCP in 1 week  2. Outpatient follow-up with home hospice at earliest convenience  Home Health: Home hospice  equipment/Devices: None  Discharge Condition: Guarded to poor CODE STATUS: DNR Diet recommendation: As tolerated and as per comfort measures  Brief/Interim Summary: 86 year old female with history of dementia, anemia presented on 05/06/2019 after a mechanical fall which resulted in a left hip fracture.  She underwent ORIF on 05/07/2019 and postoperatively had hypotension requiring initiation of pressors and transferred to ICU.  She was started on broad-spectrum antibiotics for HCAP as well.  She was found to have new onset A. fib and CHF with EF of 20 to 25% for which she is currently on oral amiodarone.  Palliative care has been consulted for goals of care discussion.  She was transferred to [facility] service on May 13, 2019.  Because of overall very poor prognosis, family has decided to take her home with hospice.  She will be discharged home with hospice today once arrangements have been made.  Discharge Diagnoses:   Septic shock: Not present on admission Healthcare associate pneumonia Postop hypotension New onset paroxysmal A. fib with RVR  Left displaced comminuted intertrochanteric hip fracture status post ORIF on 05/07/2019 Acute systolic congestive heart failure with EF of 20 to 25% Generalized deconditioning Acute blood loss anemia Thrombocytopenia Leukocytosis  Plan -She underwent ORIF on 05/07/2019.  Post-ORIF, she had developed hypotension and septic shock and was transferred to ICU.  She was treated with broad-spectrum antibiotics, pressors and intravenous hydrocortisone which is being gradually  weaned.  Subsequently, her blood pressure improved and she has been off pressors.  Cultures have been negative so far.  Currently on IV cefepime.  Will discharge on 2 more days of oral Ceftin. -She was found to have new onset A. fib with RVR and was treated with IV amiodarone and subsequently switched to oral amiodarone by cardiology.  She was also found to have EF of 20 to 25%.  Cardiology recommended palliative care follow-up.  Cardiology has signed off.  Orthopedics has signed off -Palliative care was consulted for goals of care discussion. - Because of overall very poor prognosis, family has decided to take her home with hospice.  She will be discharged home with hospice today once arrangements have been made.  Discharge Instructions  Discharge Instructions    Diet general   Complete by: As directed    Increase activity slowly   Complete by: As directed      Allergies as of 05/14/2019      Reactions   Strawberry Flavor Hives, Rash   Strawberries      Medication List    STOP taking these medications   diazepam 5 MG tablet Commonly known as: VALIUM   HYDROcodone-acetaminophen 5-325 MG tablet Commonly known as: NORCO/VICODIN     TAKE these medications   amiodarone 200 MG tablet Commonly known as: PACERONE Take 1 tablet (200 mg total) by mouth 2 (two) times daily.   bisacodyl 10 MG suppository Commonly known as: DULCOLAX Place 1 suppository (10 mg total) rectally daily as needed for severe constipation.   cefUROXime 500 MG tablet Commonly known as: CEFTIN Take 1 tablet (500 mg total) by mouth 2 (two) times daily for 2 days.   morphine CONCENTRATE  10 MG/0.5ML Soln concentrated solution Take 0.25-0.5 mLs (5-10 mg total) by mouth every 2 (two) hours as needed for moderate pain, severe pain or shortness of breath.   ondansetron 4 MG tablet Commonly known as: ZOFRAN Take 1 tablet (4 mg total) by mouth every 6 (six) hours as needed for nausea.   polyethylene glycol 17 g  packet Commonly known as: MIRALAX / GLYCOLAX Take 17 g by mouth daily as needed for moderate constipation.   senna-docusate 8.6-50 MG tablet Commonly known as: Senokot-S Take 1 tablet by mouth 2 (two) times daily.       Follow-up Information    Lewayne Bunting, MD. Schedule an appointment as soon as possible for a visit in 1 week(s).   Specialty: Cardiology Contact information: [address] [place] [address] [phone]        Home hospice Follow up.   Why: at earliest convenience         Allergies  Allergen Reactions  . Strawberry Flavor Hives and Rash    Strawberries    Consultations:  Orthopedic/PCCM/cardiology/palliative care   Procedures/Studies: DG CHEST PORT 1 VIEW  Result Date: 05/08/2019 CLINICAL DATA:  History of dementia.  Fall EXAM: PORTABLE CHEST 1 VIEW COMPARISON:  Radiograph 05/07/2019 FINDINGS: Stable enlarged cardiac silhouette. Ectatic aorta. Small RIGHT effusion. LEFT lung clear. No pneumothorax. No fracture. IMPRESSION: Small RIGHT effusion.  Otherwise no acute cardiopulmonary findings. Electronically Signed   By: Genevive Bi M.D.   On: 05/08/2019 09:25   DG CHEST PORT 1 VIEW  Result Date: 05/07/2019 CLINICAL DATA:  Pulmonary edema and altered mental status EXAM: PORTABLE CHEST 1 VIEW COMPARISON:  05/06/2019 FINDINGS: Patient is extremely rotated to the right. The left lung is clear. The visible portion of the right lung is unremarkable. Mild cardiomegaly. IMPRESSION: Cardiomegaly without focal airspace disease. The patient is extremely rotated to the right. Electronically Signed   By: Deatra Robinson M.D.   On: 05/07/2019 22:28   DG Chest Port 1 View  Result Date: 05/06/2019 CLINICAL DATA:  Preop for LEFT hip fracture EXAM: PORTABLE CHEST 1 VIEW COMPARISON:  Hip x-ray 05/06/2019 FINDINGS: Stable enlarged cardiac silhouette. Aorta is ectatic. No effusion, infiltrate pneumothorax. No acute osseous abnormality. IMPRESSION: No  acute cardiopulmonary process. Electronically Signed   By: Genevive Bi M.D.   On: 05/06/2019 14:01   DG C-Arm 1-60 Min-No Report  Result Date: 05/07/2019 Fluoroscopy was utilized by the requesting physician.  No radiographic interpretation.   ECHOCARDIOGRAM COMPLETE  Result Date: 05/07/2019    ECHOCARDIOGRAM REPORT   Patient Name:   Tiffany [name] Date of Exam: 05/07/2019 Medical Rec #:  [id number]     Height:       64.0 in Accession #:    [id number]    Weight:       135.0 lb Date of Birth:  02/13/1924     BSA:          1.655 m Patient Age:    89 years      BP:           97/56 mmHg Patient Gender: F             HR:           111 bpm. Exam Location:  Inpatient Procedure: 2D Echo                            MODIFIED REPORT: This report was  modified by Eleonore Chiquito MD on 05/07/2019 due to error.  Indications:     Atrial Fibrillation 427.31 / I48.91  History:         Patient has no prior history of Echocardiogram examinations.                  Anemia. Left hip fracture. Dementia.  Sonographer:     Darlina Sicilian RDCS Referring Phys:  [id number] Deb[address] Odea Diagnosing Phys: Eleonore Chiquito MD  Sonographer Comments: Technically challenging study due to limited acoustic windows. IMPRESSIONS  1. Difficult to assess EF due to atrial fibrillation with RVR, but appears severely reduced 20-25%. Would recommend to control rates and re-check EF.  2. Left ventricular ejection fraction, by estimation, is 20 to 25%. The left ventricle has severely decreased function. The left ventricle demonstrates global hypokinesis. Left ventricular diastolic function could not be evaluated.  3. Right ventricular systolic function is mildly reduced. The right ventricular size is mildly enlarged. There is normal pulmonary artery systolic pressure.  4. Left atrial size was mild to moderately dilated.  5. Right atrial size was mildly dilated.  6. The mitral valve is degenerative. Mild to moderate mitral valve regurgitation. No evidence of mitral  stenosis.  7. Tricuspid valve regurgitation is mild to moderate.  8. The aortic valve is tricuspid. Aortic valve regurgitation is mild. Mild to moderate aortic valve sclerosis/calcification is present, without any evidence of aortic stenosis.  9. The inferior vena cava is normal in size with <50% respiratory variability, suggesting right atrial pressure of 8 mmHg. FINDINGS  Left Ventricle: Left ventricular ejection fraction, by estimation, is 20 to 25%. The left ventricle has severely decreased function. The left ventricle demonstrates global hypokinesis. The left ventricular internal cavity size was normal in size. There is no left ventricular hypertrophy. Left ventricular diastolic function could not be evaluated. Right Ventricle: The right ventricular size is mildly enlarged. No increase in right ventricular wall thickness. Right ventricular systolic function is mildly reduced. There is normal pulmonary artery systolic pressure. The tricuspid regurgitant velocity  is 2.43 m/s, and with an assumed right atrial pressure of 8 mmHg, the estimated right ventricular systolic pressure is 14.9 mmHg. Left Atrium: Left atrial size was mild to moderately dilated. Right Atrium: Right atrial size was mildly dilated. Pericardium: There is no evidence of pericardial effusion. Mitral Valve: The mitral valve is degenerative in appearance. Mild to moderate mitral annular calcification. Mild to moderate mitral valve regurgitation. No evidence of mitral valve stenosis. Tricuspid Valve: The tricuspid valve is grossly normal. Tricuspid valve regurgitation is mild to moderate. No evidence of tricuspid stenosis. Aortic Valve: The aortic valve is tricuspid. . There is mild thickening and mild calcification of the aortic valve. Aortic valve regurgitation is mild. Aortic regurgitation PHT measures 370 msec. Mild to moderate aortic valve sclerosis/calcification is present, without any evidence of aortic stenosis. There is mild thickening of  the aortic valve. There is mild calcification of the aortic valve. Pulmonic Valve: The pulmonic valve was not assessed. Pulmonic valve regurgitation is not visualized. No evidence of pulmonic stenosis. Aorta: The aortic root is normal in size and structure. Venous: The inferior vena cava is normal in size with less than 50% respiratory variability, suggesting right atrial pressure of 8 mmHg. IAS/Shunts: No atrial level shunt detected by color flow Doppler. EKG: Rhythm strip during this exam demostrated atrial fibrillation.  LEFT VENTRICLE PLAX 2D LVOT diam:     1.90 cm  Diastology LV SV:  28       LV e' lateral:   15.90 cm/s LV SV Index:   17       LV E/e' lateral: 3.5 LVOT Area:     2.84 cm LV e' medial:    7.88 cm/s                         LV E/e' medial:  7.0  RIGHT VENTRICLE RV S prime:     10.30 cm/s TAPSE (M-mode): 1.6 cm LEFT ATRIUM             Index       RIGHT ATRIUM           Index LA Vol (A2C):   66.2 ml 39.99 ml/m RA Area:     23.00 cm LA Vol (A4C):   62.3 ml 37.63 ml/m RA Volume:   70.10 ml  42.35 ml/m LA Biplane Vol: 65.0 ml 39.27 ml/m  AORTIC VALVE LVOT Vmax:   63.30 cm/s LVOT Vmean:  46.400 cm/s LVOT VTI:    0.100 m AI PHT:      370 msec MITRAL VALVE                 TRICUSPID VALVE MV Area (PHT): 4.19 cm      TR Peak grad:   23.6 mmHg MV Decel Time: 181 msec      TR Vmax:        243.00 cm/s MR Peak grad:    58.1 mmHg MR Mean grad:    43.0 mmHg   SHUNTS MR Vmax:         381.00 cm/s Systemic VTI:  0.10 m MR Vmean:        316.0 cm/s  Systemic Diam: 1.90 cm MR PISA:         0.25 cm MR PISA Eff ROA: 3 mm MR PISA Radius:  0.20 cm MV E velocity: 55.25 cm/s Lennie Odor MD Electronically signed by Lennie Odor MD Signature Date/Time: 05/07/2019/2:06:43 PM    Final (Updated)    DG Hip Unilat W or Wo Pelvis 2-3 Views Left  Result Date: 05/06/2019 CLINICAL DATA:  Diffuse left hip pain.  Fall EXAM: DG HIP (WITH OR WITHOUT PELVIS) 2-3V LEFT COMPARISON:  None. FINDINGS: There is a left femoral  intertrochanteric fracture with varus angulation and displacement. No subluxation or dislocation. IMPRESSION: Left femoral intertrochanteric fracture with displacement and varus angulation. Electronically Signed   By: Charlett Nose M.D.   On: 05/06/2019 13:28   DG FEMUR MIN 2 VIEWS LEFT  Result Date: 05/07/2019 CLINICAL DATA:  86 year old female status post left femoral ORIF. EXAM: LEFT FEMUR 2 VIEWS COMPARISON:  Radiograph dated 05/06/2019. FINDINGS: Six fluoroscopic intraoperative images provided. The total fluoroscopic time is 26 seconds. Left femoral intramedullary rod and transcervical screw noted. IMPRESSION: Status post left femoral ORIF. Electronically Signed   By: Elgie Collard M.D.   On: 05/07/2019 20:44   DG FEMUR MIN 2 VIEWS LEFT  Result Date: 05/06/2019 CLINICAL DATA:  Preop for intramedullary nail placement. EXAM: LEFT FEMUR 2 VIEWS COMPARISON:  May 06, 2019 FINDINGS: There are advanced degenerative changes of the left knee. Evaluation of the left knee and distal left femur is limited by patient positioning. There is diffuse osteopenia. Again noted is a displaced comminuted fracture of the proximal left femur as before. IMPRESSION: 1. Acute displaced fracture of the proximal left femur as before. 2. Suboptimal evaluation of the distal left femur and  left knee secondary to patient positioning. There are degenerative changes of the left knee without definite evidence for displaced fracture. 3. Osteopenia. Electronically Signed   By: Katherine Mantle M.D.   On: 05/06/2019 14:55       Subjective: Patient seen and examined at bedside.  Spoke to son at bedside.  Patient is awake but slightly confused.  No overnight fever or vomiting reported.  Discharge Exam: Vitals:   05/13/19 2300 05/14/19 0637  BP: 104/77 (!) 113/58  Pulse: 80 70  Resp: 16 17  Temp: 98.8 F (37.1 C) 97.6 F (36.4 C)  SpO2: 95% 94%    General: Pt is elderly female lying in bed.  Awake but confused.  No  distress. Cardiovascular: rate controlled, S1/S2 + Respiratory: bilateral decreased breath sounds at bases with some scattered crackles Abdominal: Soft, NT, ND, bowel sounds + Extremities: no edema, no cyanosis    The results of significant diagnostics from this hospitalization (including imaging, microbiology, ancillary and laboratory) are listed below for reference.     Microbiology: Recent Results (from the past 240 hour(s))  Respiratory Panel by RT PCR (Flu A&B, Covid) - Nasopharyngeal Swab     Status: None   Collection Time: 05/06/19  2:50 PM   Specimen: Nasopharyngeal Swab  Result Value Ref Range Status   SARS Coronavirus 2 by RT PCR NEGATIVE NEGATIVE Final    Comment: (NOTE) SARS-CoV-2 target nucleic acids are NOT DETECTED. The SARS-CoV-2 RNA is generally detectable in upper respiratoy specimens during the acute phase of infection. The lowest concentration of SARS-CoV-2 viral copies this assay can detect is 131 copies/mL. A negative result does not preclude SARS-Cov-2 infection and should not be used as the sole basis for treatment or other patient management decisions. A negative result may occur with  improper specimen collection/handling, submission of specimen other than nasopharyngeal swab, presence of viral mutation(s) within the areas targeted by this assay, and inadequate number of viral copies (<131 copies/mL). A negative result must be combined with clinical observations, patient history, and epidemiological information. The expected result is Negative. Fact Sheet for Patients:  https://www.moore.com/ Fact Sheet for Healthcare Providers:  https://www.young.biz/ This test is not yet ap proved or cleared by the [place] FDA and  has been authorized for detection and/or diagnosis of SARS-CoV-2 by FDA under an Emergency Use Authorization (EUA). This EUA will remain  in effect (meaning this test can be used) for the  duration of the COVID-19 declaration under Section 564(b)(1) of the Act, 21 U.S.C. section 360bbb-3(b)(1), unless the authorization is terminated or revoked sooner.    Influenza A by PCR NEGATIVE NEGATIVE Final   Influenza B by PCR NEGATIVE NEGATIVE Final    Comment: (NOTE) The Xpert Xpress SARS-CoV-2/FLU/RSV assay is intended as an aid in  the diagnosis of influenza from Nasopharyngeal swab specimens and  should not be used as a sole basis for treatment. Nasal washings and  aspirates are unacceptable for Xpert Xpress SARS-CoV-2/FLU/RSV  testing. Fact Sheet for Patients: https://www.moore.com/ Fact Sheet for Healthcare Providers: https://www.young.biz/ This test is not yet approved or cleared by the [place] FDA and  has been authorized for detection and/or diagnosis of SARS-CoV-2 by  FDA under an Emergency Use Authorization (EUA). This EUA will remain  in effect (meaning this test can be used) for the duration of the  Covid-19 declaration under Section 564(b)(1) of the Act, 21  U.S.C. section 360bbb-3(b)(1), unless the authorization is  terminated or revoked. Performed at [facility],  [address], [place], [place] [address]   Surgical pcr screen     Status: Abnormal   Collection Time: 05/07/19  4:55 AM   Specimen: Nasal Mucosa; Nasal Swab  Result Value Ref Range Status   MRSA, PCR NEGATIVE NEGATIVE Final   Staphylococcus aureus POSITIVE (A) NEGATIVE Final    Comment: (NOTE) The Xpert SA Assay (FDA approved for NASAL specimens in patients 38 years of age and older), is one component of a comprehensive surveillance program. It is not intended to diagnose infection nor to guide or monitor treatment. Performed at [facility], [address], [place], [place] [address]   Culture, Urine     Status: None   Collection Time: 05/09/19  1:12 PM   Specimen: Urine, Random  Result Value Ref Range  Status   Specimen Description URINE, RANDOM  Final   Special Requests   Final    Normal Performed at [facility], [address], [place], [place] [address]    Culture NO GROWTH  Final   Report Status 05/10/2019 FINAL  Final  Culture, blood (routine x 2)     Status: None   Collection Time: 05/09/19  1:30 PM   Specimen: BLOOD RIGHT HAND  Result Value Ref Range Status   Specimen Description   Final    BLOOD RIGHT HAND Performed at [facility], [address] [place], [place] [address]    Special Requests   Final    BOTTLES DRAWN AEROBIC AND ANAEROBIC Blood Culture adequate volume Performed at [facility], [address] [place], [place] [address]    Culture   Final    NO GROWTH 5 DAYS Performed at [facility], [address] [place], [place] [address]    Report Status 05/14/2019 FINAL  Final  Culture, blood (routine x 2)     Status: None   Collection Time: 05/09/19  1:30 PM   Specimen: BLOOD LEFT HAND  Result Value Ref Range Status   Specimen Description   Final    BLOOD LEFT HAND Performed at [facility], [address] [place], [place] [address]    Special Requests   Final    BOTTLES DRAWN AEROBIC ONLY Blood Culture adequate volume Performed at [facility], [address] [place], [place] [address]    Culture   Final    NO GROWTH 5 DAYS Performed at [facility], [address] [place], [place] [address]    Report Status 05/14/2019 FINAL  Final     Labs: BNP (last 3 results) No results for input(s): BNP in the last 8760 hours. Basic Metabolic Panel: Recent Labs  Lab 05/08/19 0140 05/09/19 0521 05/10/19 0217 05/11/19 0308 05/13/19 0527  NA 141 138 135 139 136  K 4.6 3.7 3.3* 3.9 4.7  CL 105 101 102 105 104  CO2 GLUCOSE 127* 129* 203* 152* 126*  BUN 25* 31* 28* 26* 39*  CREATININE 0.79 1.08* 0.86 0.63 0.85  CALCIUM 8.5* 8.6*  8.4* 8.4* 8.6*  MG 1.5* 1.7  --   --  1.7  PHOS 3.3  --   --   --  1.7*   Liver Function Tests: Recent Labs  Lab 05/10/19 0217  AST 40  ALT 13  ALKPHOS 65  BILITOT 1.2  PROT 5.8*  ALBUMIN 3.3*   No results for input(s): LIPASE, AMYLASE in the last 168 hours.  No results for input(s): AMMONIA in the last 168 hours. CBC: Recent Labs  Lab 05/08/19 0140 05/08/19 0140 05/08/19 0828 05/09/19 0521 05/09/19 1330 05/10/19 0217 05/11/19 0308  WBC 11.4*  --   --  11.9* 10.4 10.2 11.5*  NEUTROABS  --   --   --   --  7.1 8.6*  --   HGB 9.3*   < > 9.0* 7.0* 7.9* 9.0* 8.9*  HCT 29.1*   < > 28.3* 21.6* 24.4* 27.1* 27.3*  MCV 100.3*  --   --  100.9* 96.8 93.8 96.5  PLT 92*  --   --  120* 131* 129* 191   < > = values in this interval not displayed.   Cardiac Enzymes: No results for input(s): CKTOTAL, CKMB, CKMBINDEX, TROPONINI in the last 168 hours. BNP: Invalid input(s): POCBNP CBG: No results for input(s): GLUCAP in the last 168 hours. D-Dimer No results for input(s): DDIMER in the last 72 hours. Hgb A1c No results for input(s): HGBA1C in the last 72 hours. Lipid Profile No results for input(s): CHOL, HDL, LDLCALC, TRIG, CHOLHDL, LDLDIRECT in the last 72 hours. Thyroid function studies No results for input(s): TSH, T4TOTAL, T3FREE, THYROIDAB in the last 72 hours.  Invalid input(s): FREET3 Anemia work up No results for input(s): VITAMINB12, FOLATE, FERRITIN, TIBC, IRON, RETICCTPCT in the last 72 hours. Urinalysis    Component Value Date/Time   COLORURINE YELLOW 05/09/2019 1312   APPEARANCEUR CLEAR 05/09/2019 1312   LABSPEC 1.023 05/09/2019 1312   PHURINE 5.0 05/09/2019 1312   GLUCOSEU NEGATIVE 05/09/2019 1312   HGBUR SMALL (A) 05/09/2019 1312   BILIRUBINUR NEGATIVE 05/09/2019 1312   KETONESUR NEGATIVE 05/09/2019 1312   PROTEINUR NEGATIVE 05/09/2019 1312   NITRITE NEGATIVE 05/09/2019 1312   LEUKOCYTESUR NEGATIVE 05/09/2019 1312   Sepsis Labs Invalid input(s):  PROCALCITONIN,  WBC,  LACTICIDVEN Microbiology Recent Results (from the past 240 hour(s))  Respiratory Panel by RT PCR (Flu A&B, Covid) - Nasopharyngeal Swab     Status: None   Collection Time: 05/06/19  2:50 PM   Specimen: Nasopharyngeal Swab  Result Value Ref Range Status   SARS Coronavirus 2 by RT PCR NEGATIVE NEGATIVE Final    Comment: (NOTE) SARS-CoV-2 target nucleic acids are NOT DETECTED. The SARS-CoV-2 RNA is generally detectable in upper respiratoy specimens during the acute phase of infection. The lowest concentration of SARS-CoV-2 viral copies this assay can detect is 131 copies/mL. A negative result does not preclude SARS-Cov-2 infection and should not be used as the sole basis for treatment or other patient management decisions. A negative result may occur with  improper specimen collection/handling, submission of specimen other than nasopharyngeal swab, presence of viral mutation(s) within the areas targeted by this assay, and inadequate number of viral copies (<131 copies/mL). A negative result must be combined with clinical observations, patient history, and epidemiological information. The expected result is Negative. Fact Sheet for Patients:  https://www.moore.com/https://www.fda.gov/media/142436/download Fact Sheet for Healthcare Providers:  https://www.young.biz/https://www.fda.gov/media/142435/download This test is not yet ap proved or cleared by the [place] FDA and  has been authorized for detection and/or diagnosis of SARS-CoV-2 by FDA under an Emergency Use Authorization (EUA). This EUA will remain  in effect (meaning this test can be used) for the duration of the COVID-19 declaration under Section 564(b)(1) of the Act, 21 U.S.C. section 360bbb-3(b)(1), unless the authorization is terminated or revoked sooner.    Influenza A by PCR NEGATIVE NEGATIVE Final   Influenza B by PCR NEGATIVE NEGATIVE Final  Comment: (NOTE) The Xpert Xpress SARS-CoV-2/FLU/RSV assay is intended as an aid in  the  diagnosis of influenza from Nasopharyngeal swab specimens and  should not be used as a sole basis for treatment. Nasal washings and  aspirates are unacceptable for Xpert Xpress SARS-CoV-2/FLU/RSV  testing. Fact Sheet for Patients: https://www.moore.com/ Fact Sheet for Healthcare Providers: https://www.young.biz/ This test is not yet approved or cleared by the [place] FDA and  has been authorized for detection and/or diagnosis of SARS-CoV-2 by  FDA under an Emergency Use Authorization (EUA). This EUA will remain  in effect (meaning this test can be used) for the duration of the  Covid-19 declaration under Section 564(b)(1) of the Act, 21  U.S.C. section 360bbb-3(b)(1), unless the authorization is  terminated or revoked. Performed at [facility], [address] [place], [place] [id number]   Surgical pcr screen     Status: Abnormal   Collection Time: 05/07/19  4:55 AM   Specimen: Nasal Mucosa; Nasal Swab  Result Value Ref Range Status   MRSA, PCR NEGATIVE NEGATIVE Final   Staphylococcus aureus POSITIVE (A) NEGATIVE Final    Comment: (NOTE) The Xpert SA Assay (FDA approved for NASAL specimens in patients 52 years of age and older), is one component of a comprehensive surveillance program. It is not intended to diagnose infection nor to guide or monitor treatment. Performed at [facility], [address], [place], [place] [address]   Culture, Urine     Status: None   Collection Time: 05/09/19  1:12 PM   Specimen: Urine, Random  Result Value Ref Range Status   Specimen Description URINE, RANDOM  Final   Special Requests   Final    Normal Performed at [facility], [address], [place], [place] [address]    Culture NO GROWTH  Final   Report Status 05/10/2019 FINAL  Final  Culture, blood (routine x 2)     Status: None   Collection Time: 05/09/19  1:30 PM   Specimen: BLOOD  RIGHT HAND  Result Value Ref Range Status   Specimen Description   Final    BLOOD RIGHT HAND Performed at [facility], [address], [place], [place] [address]    Special Requests   Final    BOTTLES DRAWN AEROBIC AND ANAEROBIC Blood Culture adequate volume Performed at [facility], [address] [place], [place] [address]    Culture   Final    NO GROWTH 5 DAYS Performed at [facility], [address] [place], [place] [address]    Report Status 05/14/2019 FINAL  Final  Culture, blood (routine x 2)     Status: None   Collection Time: 05/09/19  1:30 PM   Specimen: BLOOD LEFT HAND  Result Value Ref Range Status   Specimen Description   Final    BLOOD LEFT HAND Performed at [facility], [address], [place], [place] [address]    Special Requests   Final    BOTTLES DRAWN AEROBIC ONLY Blood Culture adequate volume Performed at [facility], [address], [place], [place] [address]    Culture   Final    NO GROWTH 5 DAYS Performed at [facility], [address] [place], [place] [address]    Report Status 05/14/2019 FINAL  Final     Time coordinating discharge: 35 minutes  SIGNED:   Glade Lloyd, MD  [facility] 05/14/2019, 11:16  AM    

## 2020-05-20 DEATH — deceased

## 2020-08-17 IMAGING — CR DG FEMUR 2+V*L*
5 series · 5 of 5 positions shown · non-contrast
Comparison: May 06, 2019

CLINICAL DATA: Preop for intramedullary nail placement.

EXAM:
LEFT FEMUR 2 VIEWS

[x femur distal lat left]
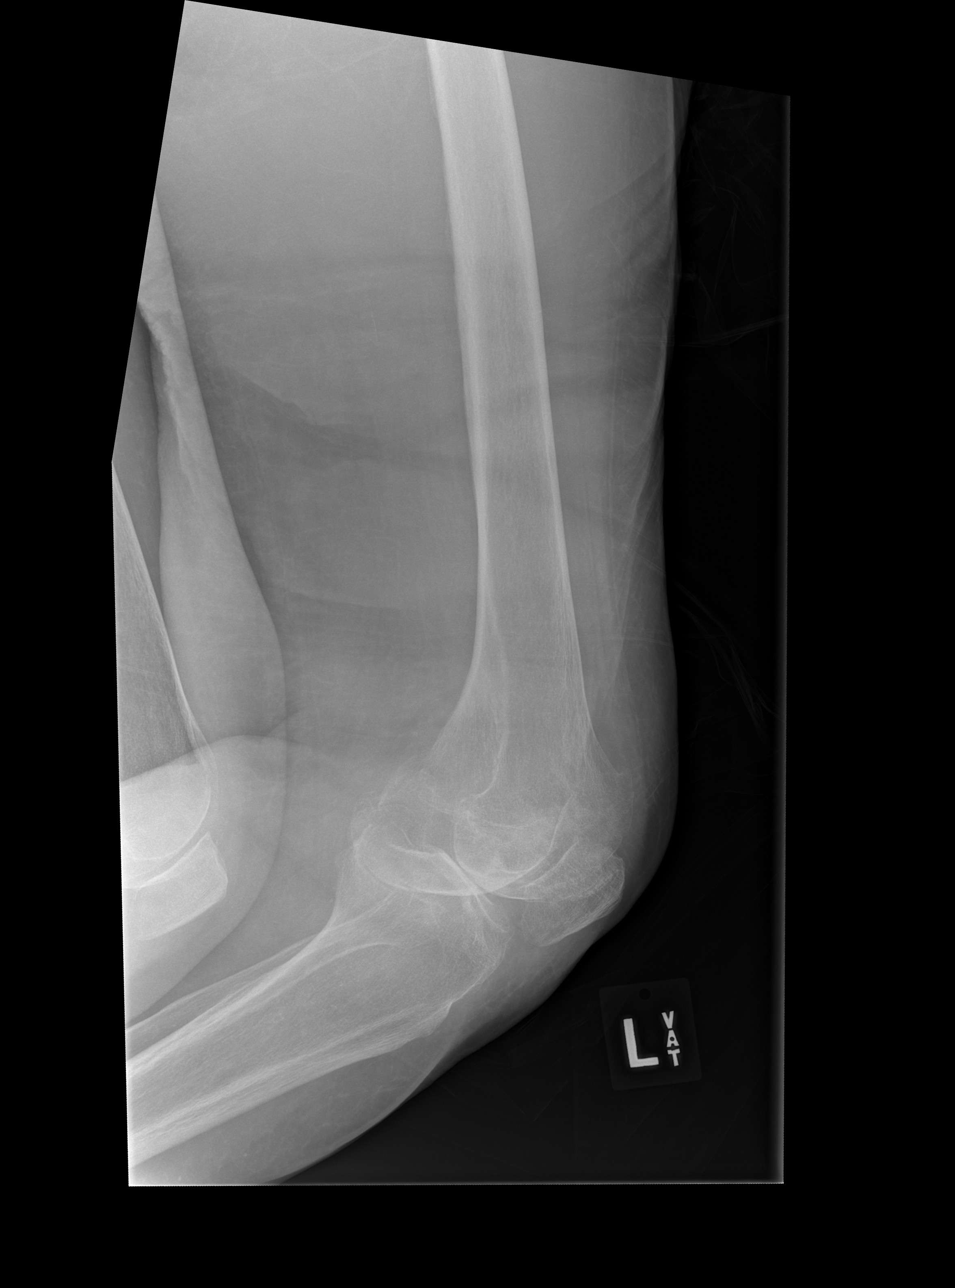

[x femur distal ap left]
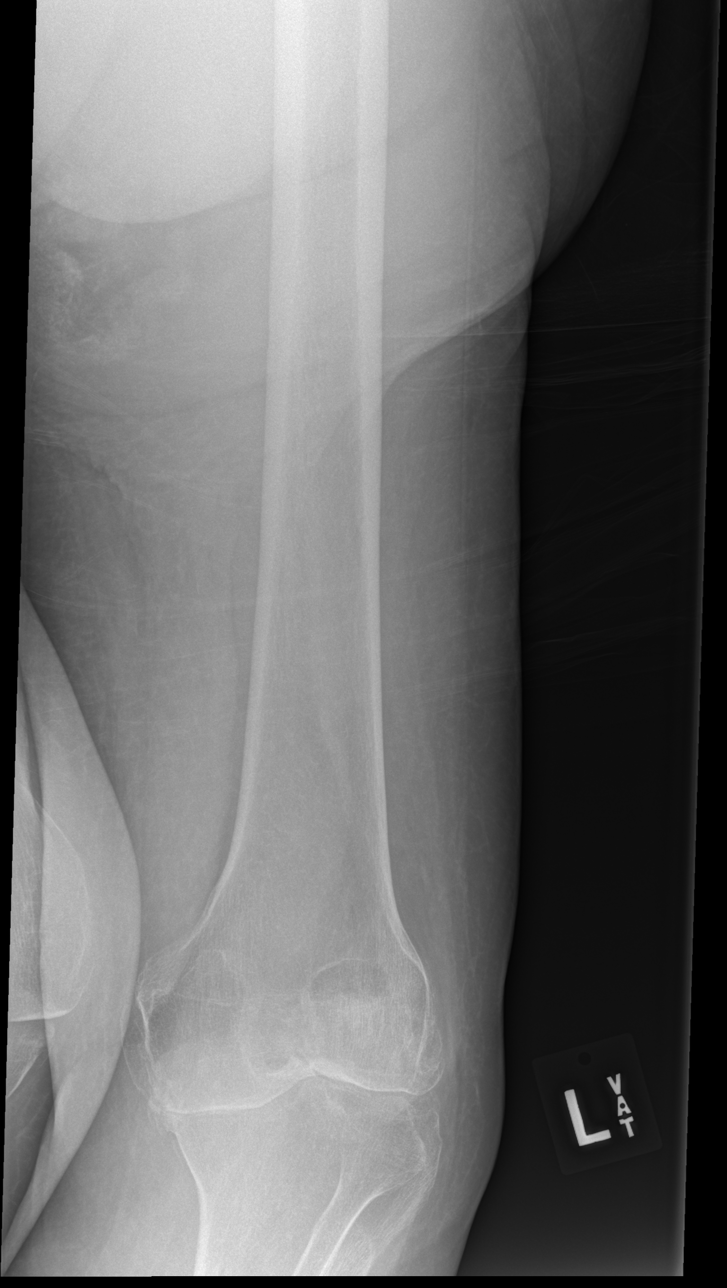

[x femur proximal ap left (1 of 2)]
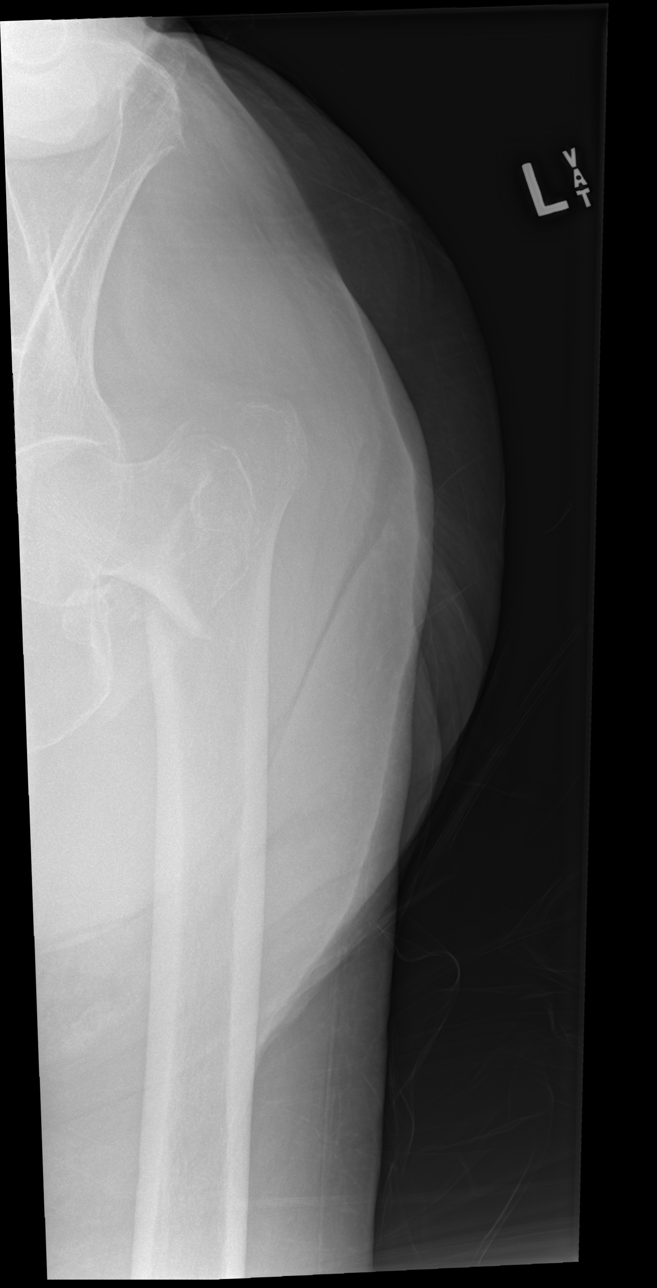

[x femur proximal ap left (2 of 2)]
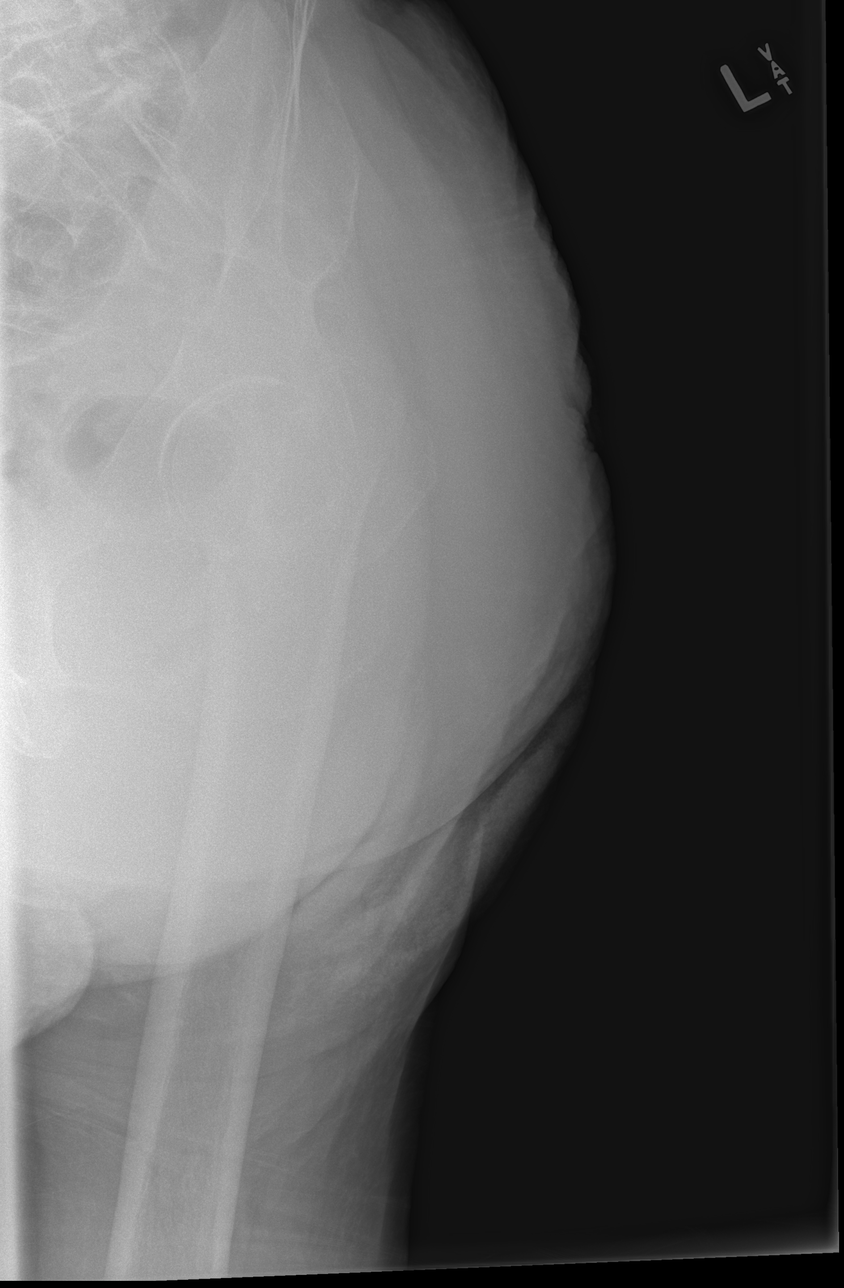

[w femur distal lat left]
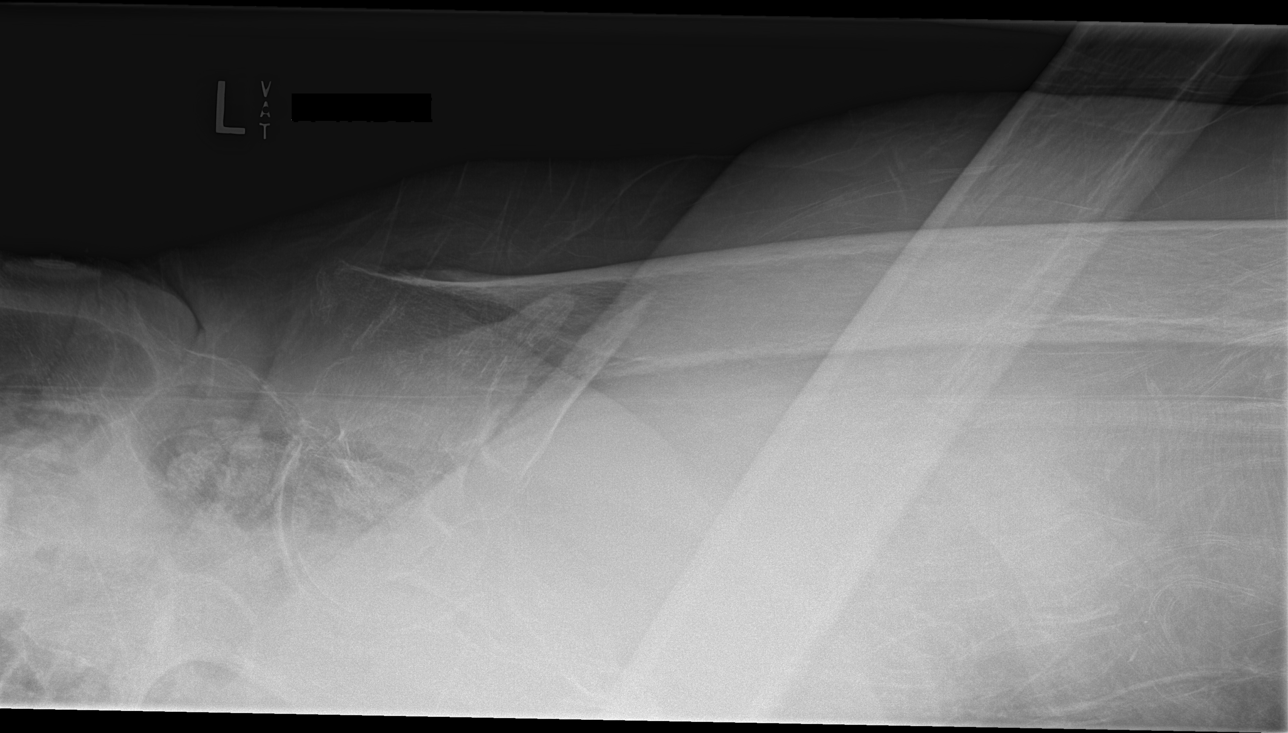

[5 of 5 positions shown; findings below may reference images not displayed]

FINDINGS: There are advanced degenerative changes of the left knee. Evaluation
of the left knee and distal left femur is limited by patient
positioning. There is diffuse osteopenia. Again noted is a displaced
comminuted fracture of the proximal left femur as before.
IMPRESSION: 1. Acute displaced fracture of the proximal left femur as before.
2. Suboptimal evaluation of the distal left femur and left knee
secondary to patient positioning. There are degenerative changes of
the left knee without definite evidence for displaced fracture.
3. Osteopenia.
